# Patient Record
Sex: Female | Born: 1945 | Race: White | Hispanic: No | Marital: Single | Smoking: Former smoker
Health system: Southern US, Community
[De-identification: ages and names within clinical notes are randomized; demographics above are authoritative.]

## PROBLEM LIST (undated history)

## (undated) DIAGNOSIS — I779 Disorder of arteries and arterioles, unspecified: Secondary | ICD-10-CM

## (undated) DIAGNOSIS — E785 Hyperlipidemia, unspecified: Secondary | ICD-10-CM

## (undated) DIAGNOSIS — Z800 Family history of malignant neoplasm of digestive organs: Secondary | ICD-10-CM

## (undated) DIAGNOSIS — T7840XA Allergy, unspecified, initial encounter: Secondary | ICD-10-CM

## (undated) DIAGNOSIS — C50919 Malignant neoplasm of unspecified site of unspecified female breast: Secondary | ICD-10-CM

## (undated) DIAGNOSIS — D509 Iron deficiency anemia, unspecified: Secondary | ICD-10-CM

## (undated) DIAGNOSIS — K219 Gastro-esophageal reflux disease without esophagitis: Secondary | ICD-10-CM

## (undated) DIAGNOSIS — Z923 Personal history of irradiation: Secondary | ICD-10-CM

## (undated) DIAGNOSIS — Z8042 Family history of malignant neoplasm of prostate: Secondary | ICD-10-CM

## (undated) DIAGNOSIS — Z8041 Family history of malignant neoplasm of ovary: Secondary | ICD-10-CM

## (undated) DIAGNOSIS — M8580 Other specified disorders of bone density and structure, unspecified site: Secondary | ICD-10-CM

## (undated) DIAGNOSIS — Z853 Personal history of malignant neoplasm of breast: Secondary | ICD-10-CM

## (undated) DIAGNOSIS — K2970 Gastritis, unspecified, without bleeding: Secondary | ICD-10-CM

## (undated) DIAGNOSIS — K2270 Barrett's esophagus without dysplasia: Secondary | ICD-10-CM

## (undated) HISTORY — DX: Barrett's esophagus without dysplasia: K22.70

## (undated) HISTORY — DX: Disorder of arteries and arterioles, unspecified: I77.9

## (undated) HISTORY — PX: COLONOSCOPY: SHX174

## (undated) HISTORY — PX: COLONOSCOPY: SHX5424

## (undated) HISTORY — PX: UPPER GASTROINTESTINAL ENDOSCOPY: SHX188

## (undated) HISTORY — PX: OVARIAN CYST REMOVAL: SHX89

## (undated) HISTORY — DX: Malignant neoplasm of unspecified site of unspecified female breast: C50.919

## (undated) HISTORY — PX: BREAST LUMPECTOMY: SHX2

## (undated) HISTORY — DX: Hyperlipidemia, unspecified: E78.5

## (undated) HISTORY — PX: ESOPHAGOGASTRODUODENOSCOPY: SHX1529

## (undated) HISTORY — DX: Iron deficiency anemia, unspecified: D50.9

## (undated) HISTORY — DX: Family history of malignant neoplasm of digestive organs: Z80.0

## (undated) HISTORY — PX: BREAST BIOPSY: SHX20

## (undated) HISTORY — DX: Family history of malignant neoplasm of ovary: Z80.41

## (undated) HISTORY — DX: Personal history of malignant neoplasm of breast: Z85.3

## (undated) HISTORY — DX: Gastritis, unspecified, without bleeding: K29.70

## (undated) HISTORY — DX: Gastro-esophageal reflux disease without esophagitis: K21.9

## (undated) HISTORY — DX: Allergy, unspecified, initial encounter: T78.40XA

## (undated) HISTORY — PX: OTHER SURGICAL HISTORY: SHX169

## (undated) HISTORY — DX: Other specified disorders of bone density and structure, unspecified site: M85.80

## (undated) HISTORY — DX: Family history of malignant neoplasm of prostate: Z80.42

## (undated) HISTORY — DX: Personal history of irradiation: Z92.3

---

## 1982-11-27 HISTORY — PX: ABDOMINAL HYSTERECTOMY: SHX81

## 1999-09-23 ENCOUNTER — Encounter: Payer: Self-pay | Admitting: Obstetrics and Gynecology

## 1999-09-23 ENCOUNTER — Encounter: Admission: RE | Admit: 1999-09-23 | Discharge: 1999-09-23 | Payer: Self-pay | Admitting: Obstetrics and Gynecology

## 2000-04-19 ENCOUNTER — Emergency Department (HOSPITAL_COMMUNITY): Admission: EM | Admit: 2000-04-19 | Discharge: 2000-04-19 | Payer: Self-pay

## 2000-04-19 ENCOUNTER — Encounter: Payer: Self-pay | Admitting: Emergency Medicine

## 2000-06-22 ENCOUNTER — Other Ambulatory Visit: Admission: RE | Admit: 2000-06-22 | Discharge: 2000-06-22 | Payer: Self-pay | Admitting: Family Medicine

## 2000-06-27 ENCOUNTER — Encounter: Admission: RE | Admit: 2000-06-27 | Discharge: 2000-06-27 | Payer: Self-pay | Admitting: Family Medicine

## 2000-06-27 ENCOUNTER — Encounter: Payer: Self-pay | Admitting: Family Medicine

## 2000-07-02 ENCOUNTER — Encounter: Admission: RE | Admit: 2000-07-02 | Discharge: 2000-07-02 | Payer: Self-pay | Admitting: Family Medicine

## 2000-07-02 ENCOUNTER — Encounter: Payer: Self-pay | Admitting: Family Medicine

## 2000-07-27 ENCOUNTER — Ambulatory Visit (HOSPITAL_BASED_OUTPATIENT_CLINIC_OR_DEPARTMENT_OTHER): Admission: RE | Admit: 2000-07-27 | Discharge: 2000-07-27 | Payer: Self-pay | Admitting: General Surgery

## 2000-11-02 ENCOUNTER — Other Ambulatory Visit: Admission: RE | Admit: 2000-11-02 | Discharge: 2000-11-02 | Payer: Self-pay | Admitting: Obstetrics and Gynecology

## 2001-07-19 ENCOUNTER — Encounter: Payer: Self-pay | Admitting: Obstetrics and Gynecology

## 2001-07-19 ENCOUNTER — Encounter: Admission: RE | Admit: 2001-07-19 | Discharge: 2001-07-19 | Payer: Self-pay | Admitting: Obstetrics and Gynecology

## 2002-11-27 HISTORY — PX: OTHER SURGICAL HISTORY: SHX169

## 2002-12-12 ENCOUNTER — Encounter: Admission: RE | Admit: 2002-12-12 | Discharge: 2002-12-12 | Payer: Self-pay | Admitting: Obstetrics and Gynecology

## 2002-12-12 ENCOUNTER — Encounter: Payer: Self-pay | Admitting: Obstetrics and Gynecology

## 2002-12-16 ENCOUNTER — Other Ambulatory Visit: Admission: RE | Admit: 2002-12-16 | Discharge: 2002-12-16 | Payer: Self-pay | Admitting: Diagnostic Radiology

## 2002-12-16 ENCOUNTER — Encounter: Admission: RE | Admit: 2002-12-16 | Discharge: 2002-12-16 | Payer: Self-pay | Admitting: Obstetrics and Gynecology

## 2002-12-16 ENCOUNTER — Encounter: Payer: Self-pay | Admitting: Obstetrics and Gynecology

## 2002-12-16 ENCOUNTER — Encounter (INDEPENDENT_AMBULATORY_CARE_PROVIDER_SITE_OTHER): Payer: Self-pay | Admitting: *Deleted

## 2002-12-24 ENCOUNTER — Encounter: Payer: Self-pay | Admitting: General Surgery

## 2002-12-24 ENCOUNTER — Ambulatory Visit (HOSPITAL_COMMUNITY): Admission: RE | Admit: 2002-12-24 | Discharge: 2002-12-24 | Payer: Self-pay | Admitting: General Surgery

## 2002-12-26 ENCOUNTER — Encounter: Payer: Self-pay | Admitting: General Surgery

## 2002-12-26 ENCOUNTER — Encounter: Admission: RE | Admit: 2002-12-26 | Discharge: 2002-12-26 | Payer: Self-pay | Admitting: General Surgery

## 2002-12-28 DIAGNOSIS — C50919 Malignant neoplasm of unspecified site of unspecified female breast: Secondary | ICD-10-CM

## 2002-12-28 HISTORY — DX: Malignant neoplasm of unspecified site of unspecified female breast: C50.919

## 2002-12-29 ENCOUNTER — Ambulatory Visit (HOSPITAL_BASED_OUTPATIENT_CLINIC_OR_DEPARTMENT_OTHER): Admission: RE | Admit: 2002-12-29 | Discharge: 2002-12-29 | Payer: Self-pay | Admitting: General Surgery

## 2002-12-29 ENCOUNTER — Encounter: Admission: RE | Admit: 2002-12-29 | Discharge: 2002-12-29 | Payer: Self-pay | Admitting: General Surgery

## 2002-12-29 ENCOUNTER — Encounter: Payer: Self-pay | Admitting: General Surgery

## 2002-12-29 ENCOUNTER — Encounter (INDEPENDENT_AMBULATORY_CARE_PROVIDER_SITE_OTHER): Payer: Self-pay | Admitting: Specialist

## 2003-01-13 ENCOUNTER — Ambulatory Visit: Admission: RE | Admit: 2003-01-13 | Discharge: 2003-04-02 | Payer: Self-pay | Admitting: *Deleted

## 2003-01-15 ENCOUNTER — Encounter: Admission: RE | Admit: 2003-01-15 | Discharge: 2003-01-15 | Payer: Self-pay | Admitting: *Deleted

## 2003-01-26 ENCOUNTER — Encounter: Admission: RE | Admit: 2003-01-26 | Discharge: 2003-01-26 | Payer: Self-pay | Admitting: *Deleted

## 2003-01-26 ENCOUNTER — Encounter: Payer: Self-pay | Admitting: Oncology

## 2003-01-26 ENCOUNTER — Ambulatory Visit (HOSPITAL_COMMUNITY): Admission: RE | Admit: 2003-01-26 | Discharge: 2003-01-26 | Payer: Self-pay | Admitting: Oncology

## 2003-04-14 ENCOUNTER — Ambulatory Visit: Admission: RE | Admit: 2003-04-14 | Discharge: 2003-04-14 | Payer: Self-pay | Admitting: *Deleted

## 2003-08-04 ENCOUNTER — Encounter: Payer: Self-pay | Admitting: Oncology

## 2003-08-04 ENCOUNTER — Encounter: Admission: RE | Admit: 2003-08-04 | Discharge: 2003-08-04 | Payer: Self-pay | Admitting: Oncology

## 2004-01-25 ENCOUNTER — Encounter (INDEPENDENT_AMBULATORY_CARE_PROVIDER_SITE_OTHER): Payer: Self-pay | Admitting: Specialist

## 2004-01-25 ENCOUNTER — Encounter: Admission: RE | Admit: 2004-01-25 | Discharge: 2004-01-25 | Payer: Self-pay | Admitting: Oncology

## 2004-02-22 ENCOUNTER — Encounter: Admission: RE | Admit: 2004-02-22 | Discharge: 2004-02-22 | Payer: Self-pay | Admitting: Oncology

## 2004-03-08 ENCOUNTER — Encounter: Admission: RE | Admit: 2004-03-08 | Discharge: 2004-03-08 | Payer: Self-pay | Admitting: Oncology

## 2004-03-17 ENCOUNTER — Encounter: Admission: RE | Admit: 2004-03-17 | Discharge: 2004-03-17 | Payer: Self-pay | Admitting: Oncology

## 2004-03-25 ENCOUNTER — Encounter: Admission: RE | Admit: 2004-03-25 | Discharge: 2004-03-25 | Payer: Self-pay | Admitting: General Surgery

## 2004-05-13 ENCOUNTER — Encounter: Admission: RE | Admit: 2004-05-13 | Discharge: 2004-05-13 | Payer: Self-pay | Admitting: Oncology

## 2004-05-24 ENCOUNTER — Ambulatory Visit: Admission: RE | Admit: 2004-05-24 | Discharge: 2004-07-01 | Payer: Self-pay | Admitting: *Deleted

## 2004-05-25 ENCOUNTER — Ambulatory Visit (HOSPITAL_COMMUNITY): Admission: RE | Admit: 2004-05-25 | Discharge: 2004-05-25 | Payer: Self-pay | Admitting: Oncology

## 2004-08-05 ENCOUNTER — Ambulatory Visit: Admission: RE | Admit: 2004-08-05 | Discharge: 2004-08-05 | Payer: Self-pay | Admitting: *Deleted

## 2004-10-07 ENCOUNTER — Encounter: Admission: RE | Admit: 2004-10-07 | Discharge: 2004-10-07 | Payer: Self-pay | Admitting: *Deleted

## 2005-01-27 ENCOUNTER — Encounter: Admission: RE | Admit: 2005-01-27 | Discharge: 2005-01-27 | Payer: Self-pay | Admitting: Oncology

## 2005-02-09 ENCOUNTER — Ambulatory Visit: Payer: Self-pay | Admitting: Oncology

## 2005-02-09 ENCOUNTER — Encounter: Admission: RE | Admit: 2005-02-09 | Discharge: 2005-02-09 | Payer: Self-pay | Admitting: General Surgery

## 2005-04-06 ENCOUNTER — Ambulatory Visit: Payer: Self-pay | Admitting: Oncology

## 2005-07-27 ENCOUNTER — Ambulatory Visit: Payer: Self-pay | Admitting: Oncology

## 2005-10-13 ENCOUNTER — Encounter: Admission: RE | Admit: 2005-10-13 | Discharge: 2005-10-13 | Payer: Self-pay | Admitting: Oncology

## 2005-11-27 DIAGNOSIS — K2270 Barrett's esophagus without dysplasia: Secondary | ICD-10-CM

## 2005-11-27 HISTORY — DX: Barrett's esophagus without dysplasia: K22.70

## 2006-01-25 ENCOUNTER — Ambulatory Visit: Payer: Self-pay | Admitting: Oncology

## 2006-01-31 ENCOUNTER — Encounter: Admission: RE | Admit: 2006-01-31 | Discharge: 2006-01-31 | Payer: Self-pay | Admitting: Oncology

## 2006-02-01 ENCOUNTER — Encounter: Admission: RE | Admit: 2006-02-01 | Discharge: 2006-02-01 | Payer: Self-pay | Admitting: Oncology

## 2006-02-01 ENCOUNTER — Encounter (INDEPENDENT_AMBULATORY_CARE_PROVIDER_SITE_OTHER): Payer: Self-pay | Admitting: *Deleted

## 2006-02-23 ENCOUNTER — Encounter: Admission: RE | Admit: 2006-02-23 | Discharge: 2006-02-23 | Payer: Self-pay | Admitting: Oncology

## 2006-02-28 LAB — CLOSTRIDIUM DIFFICILE EIA

## 2006-03-06 LAB — CBC WITH DIFFERENTIAL/PLATELET
BASO%: 0.4 % (ref 0.0–2.0)
Basophils Absolute: 0 10*3/uL (ref 0.0–0.1)
EOS%: 0.4 % (ref 0.0–7.0)
Eosinophils Absolute: 0 10*3/uL (ref 0.0–0.5)
HCT: 34.7 % — ABNORMAL LOW (ref 34.8–46.6)
HGB: 10.9 g/dL — ABNORMAL LOW (ref 11.6–15.9)
LYMPH%: 17.5 % (ref 14.0–48.0)
MCH: 22.5 pg — ABNORMAL LOW (ref 26.0–34.0)
MCHC: 31.4 g/dL — ABNORMAL LOW (ref 32.0–36.0)
MCV: 71.8 fL — ABNORMAL LOW (ref 81.0–101.0)
MONO#: 0.6 10*3/uL (ref 0.1–0.9)
MONO%: 7.5 % (ref 0.0–13.0)
NEUT#: 6.3 10*3/uL (ref 1.5–6.5)
NEUT%: 74.2 % (ref 39.6–76.8)
Platelets: 318 10*3/uL (ref 145–400)
RBC: 4.83 10*6/uL (ref 3.70–5.32)
RDW: 23.1 % — ABNORMAL HIGH (ref 11.3–14.5)
WBC: 8.5 10*3/uL (ref 3.9–10.0)
lymph#: 1.5 10*3/uL (ref 0.9–3.3)

## 2006-03-12 ENCOUNTER — Ambulatory Visit (HOSPITAL_COMMUNITY): Admission: RE | Admit: 2006-03-12 | Discharge: 2006-03-12 | Payer: Self-pay | Admitting: Unknown Physician Specialty

## 2006-04-13 ENCOUNTER — Ambulatory Visit: Payer: Self-pay | Admitting: Oncology

## 2006-04-13 LAB — CBC WITH DIFFERENTIAL/PLATELET
BASO%: 0.5 % (ref 0.0–2.0)
Basophils Absolute: 0 10*3/uL (ref 0.0–0.1)
EOS%: 1.8 % (ref 0.0–7.0)
Eosinophils Absolute: 0.1 10*3/uL (ref 0.0–0.5)
HCT: 33.7 % — ABNORMAL LOW (ref 34.8–46.6)
HGB: 10.8 g/dL — ABNORMAL LOW (ref 11.6–15.9)
LYMPH%: 27 % (ref 14.0–48.0)
MCH: 22.7 pg — ABNORMAL LOW (ref 26.0–34.0)
MCHC: 31.9 g/dL — ABNORMAL LOW (ref 32.0–36.0)
MCV: 71.3 fL — ABNORMAL LOW (ref 81.0–101.0)
MONO#: 0.5 10*3/uL (ref 0.1–0.9)
MONO%: 7.9 % (ref 0.0–13.0)
NEUT#: 3.6 10*3/uL (ref 1.5–6.5)
NEUT%: 62.8 % (ref 39.6–76.8)
Platelets: 294 10*3/uL (ref 145–400)
RBC: 4.73 10*6/uL (ref 3.70–5.32)
RDW: 19.5 % — ABNORMAL HIGH (ref 11.3–14.5)
WBC: 5.7 10*3/uL (ref 3.9–10.0)
lymph#: 1.5 10*3/uL (ref 0.9–3.3)

## 2006-05-08 ENCOUNTER — Ambulatory Visit: Payer: Self-pay | Admitting: Internal Medicine

## 2006-05-21 ENCOUNTER — Ambulatory Visit: Payer: Self-pay | Admitting: Internal Medicine

## 2006-06-18 ENCOUNTER — Ambulatory Visit: Payer: Self-pay | Admitting: Oncology

## 2006-06-22 LAB — CBC WITH DIFFERENTIAL/PLATELET
BASO%: 0.9 % (ref 0.0–2.0)
Basophils Absolute: 0 10*3/uL (ref 0.0–0.1)
EOS%: 1.1 % (ref 0.0–7.0)
Eosinophils Absolute: 0.1 10*3/uL (ref 0.0–0.5)
HCT: 39.9 % (ref 34.8–46.6)
HGB: 13.1 g/dL (ref 11.6–15.9)
LYMPH%: 34.7 % (ref 14.0–48.0)
MCH: 25.8 pg — ABNORMAL LOW (ref 26.0–34.0)
MCHC: 32.9 g/dL (ref 32.0–36.0)
MCV: 78.4 fL — ABNORMAL LOW (ref 81.0–101.0)
MONO#: 0.4 10*3/uL (ref 0.1–0.9)
MONO%: 8 % (ref 0.0–13.0)
NEUT#: 2.8 10*3/uL (ref 1.5–6.5)
NEUT%: 55.3 % (ref 39.6–76.8)
Platelets: 267 10*3/uL (ref 145–400)
RBC: 5.09 10*6/uL (ref 3.70–5.32)
RDW: 21.2 % — ABNORMAL HIGH (ref 11.3–14.5)
WBC: 5.1 10*3/uL (ref 3.9–10.0)
lymph#: 1.8 10*3/uL (ref 0.9–3.3)

## 2006-06-22 LAB — IRON AND TIBC
%SAT: 14 % — ABNORMAL LOW (ref 20–55)
Iron: 72 ug/dL (ref 42–145)
TIBC: 503 ug/dL — ABNORMAL HIGH (ref 250–470)
UIBC: 431 ug/dL

## 2006-06-22 LAB — FERRITIN: Ferritin: 3 ng/mL — ABNORMAL LOW (ref 10–291)

## 2006-06-29 ENCOUNTER — Ambulatory Visit: Payer: Self-pay | Admitting: Internal Medicine

## 2006-08-02 ENCOUNTER — Ambulatory Visit: Payer: Self-pay | Admitting: Internal Medicine

## 2006-09-12 ENCOUNTER — Ambulatory Visit: Payer: Self-pay | Admitting: Oncology

## 2006-12-11 ENCOUNTER — Ambulatory Visit: Payer: Self-pay | Admitting: Oncology

## 2006-12-14 LAB — COMPREHENSIVE METABOLIC PANEL
ALT: 16 U/L (ref 0–35)
AST: 17 U/L (ref 0–37)
Albumin: 4.7 g/dL (ref 3.5–5.2)
Alkaline Phosphatase: 74 U/L (ref 39–117)
BUN: 16 mg/dL (ref 6–23)
CO2: 22 mEq/L (ref 19–32)
Calcium: 9 mg/dL (ref 8.4–10.5)
Chloride: 110 mEq/L (ref 96–112)
Creatinine, Ser: 0.91 mg/dL (ref 0.40–1.20)
Glucose, Bld: 85 mg/dL (ref 70–99)
Potassium: 3.7 mEq/L (ref 3.5–5.3)
Sodium: 145 mEq/L (ref 135–145)
Total Bilirubin: 0.6 mg/dL (ref 0.3–1.2)
Total Protein: 6.9 g/dL (ref 6.0–8.3)

## 2006-12-14 LAB — CBC WITH DIFFERENTIAL/PLATELET
BASO%: 0.7 % (ref 0.0–2.0)
Basophils Absolute: 0 10*3/uL (ref 0.0–0.1)
EOS%: 1.9 % (ref 0.0–7.0)
Eosinophils Absolute: 0.1 10*3/uL (ref 0.0–0.5)
HCT: 42.7 % (ref 34.8–46.6)
HGB: 13.9 g/dL (ref 11.6–15.9)
LYMPH%: 31.2 % (ref 14.0–48.0)
MCH: 29.1 pg (ref 26.0–34.0)
MCHC: 32.6 g/dL (ref 32.0–36.0)
MCV: 89.4 fL (ref 81.0–101.0)
MONO#: 0.4 10*3/uL (ref 0.1–0.9)
MONO%: 6.6 % (ref 0.0–13.0)
NEUT#: 3.2 10*3/uL (ref 1.5–6.5)
NEUT%: 59.6 % (ref 39.6–76.8)
Platelets: 221 10*3/uL (ref 145–400)
RBC: 4.77 10*6/uL (ref 3.70–5.32)
RDW: 14.3 % (ref 11.3–14.5)
WBC: 5.4 10*3/uL (ref 3.9–10.0)
lymph#: 1.7 10*3/uL (ref 0.9–3.3)

## 2006-12-14 LAB — IRON AND TIBC
%SAT: 14 % — ABNORMAL LOW (ref 20–55)
Iron: 58 ug/dL (ref 42–145)
TIBC: 418 ug/dL (ref 250–470)
UIBC: 360 ug/dL

## 2006-12-14 LAB — FERRITIN: Ferritin: 7 ng/mL — ABNORMAL LOW (ref 10–291)

## 2007-02-08 ENCOUNTER — Encounter: Admission: RE | Admit: 2007-02-08 | Discharge: 2007-02-08 | Payer: Self-pay | Admitting: Oncology

## 2007-05-29 ENCOUNTER — Ambulatory Visit: Payer: Self-pay | Admitting: Internal Medicine

## 2007-06-12 ENCOUNTER — Ambulatory Visit: Payer: Self-pay | Admitting: Internal Medicine

## 2007-07-24 ENCOUNTER — Ambulatory Visit: Payer: Self-pay | Admitting: Oncology

## 2007-07-26 LAB — CBC WITH DIFFERENTIAL/PLATELET
BASO%: 0.6 % (ref 0.0–2.0)
Basophils Absolute: 0 10*3/uL (ref 0.0–0.1)
EOS%: 1.8 % (ref 0.0–7.0)
Eosinophils Absolute: 0.1 10*3/uL (ref 0.0–0.5)
HCT: 42.9 % (ref 34.8–46.6)
HGB: 15.2 g/dL (ref 11.6–15.9)
LYMPH%: 37.7 % (ref 14.0–48.0)
MCH: 32.5 pg (ref 26.0–34.0)
MCHC: 35.4 g/dL (ref 32.0–36.0)
MCV: 91.8 fL (ref 81.0–101.0)
MONO#: 0.4 10*3/uL (ref 0.1–0.9)
MONO%: 7.4 % (ref 0.0–13.0)
NEUT#: 2.9 10*3/uL (ref 1.5–6.5)
NEUT%: 52.5 % (ref 39.6–76.8)
Platelets: 220 10*3/uL (ref 145–400)
RBC: 4.68 10*6/uL (ref 3.70–5.32)
RDW: 13 % (ref 11.3–14.5)
WBC: 5.5 10*3/uL (ref 3.9–10.0)
lymph#: 2.1 10*3/uL (ref 0.9–3.3)

## 2007-07-27 LAB — COMPREHENSIVE METABOLIC PANEL
ALT: 27 U/L (ref 0–35)
AST: 22 U/L (ref 0–37)
Albumin: 4.7 g/dL (ref 3.5–5.2)
Alkaline Phosphatase: 80 U/L (ref 39–117)
BUN: 18 mg/dL (ref 6–23)
CO2: 22 mEq/L (ref 19–32)
Calcium: 8.8 mg/dL (ref 8.4–10.5)
Chloride: 106 mEq/L (ref 96–112)
Creatinine, Ser: 1.04 mg/dL (ref 0.40–1.20)
Glucose, Bld: 87 mg/dL (ref 70–99)
Potassium: 3.6 mEq/L (ref 3.5–5.3)
Sodium: 138 mEq/L (ref 135–145)
Total Bilirubin: 0.6 mg/dL (ref 0.3–1.2)
Total Protein: 7 g/dL (ref 6.0–8.3)

## 2007-07-27 LAB — FERRITIN: Ferritin: 24 ng/mL (ref 10–291)

## 2007-10-04 ENCOUNTER — Encounter: Admission: RE | Admit: 2007-10-04 | Discharge: 2007-10-04 | Payer: Self-pay | Admitting: Oncology

## 2008-01-29 ENCOUNTER — Ambulatory Visit: Payer: Self-pay | Admitting: Oncology

## 2008-01-31 LAB — COMPREHENSIVE METABOLIC PANEL
ALT: 30 U/L (ref 0–35)
AST: 22 U/L (ref 0–37)
Albumin: 4.4 g/dL (ref 3.5–5.2)
Alkaline Phosphatase: 77 U/L (ref 39–117)
BUN: 15 mg/dL (ref 6–23)
CO2: 23 mEq/L (ref 19–32)
Calcium: 9.2 mg/dL (ref 8.4–10.5)
Chloride: 109 mEq/L (ref 96–112)
Creatinine, Ser: 0.99 mg/dL (ref 0.40–1.20)
Glucose, Bld: 102 mg/dL — ABNORMAL HIGH (ref 70–99)
Potassium: 4.6 mEq/L (ref 3.5–5.3)
Sodium: 142 mEq/L (ref 135–145)
Total Bilirubin: 0.6 mg/dL (ref 0.3–1.2)
Total Protein: 6.7 g/dL (ref 6.0–8.3)

## 2008-01-31 LAB — CBC WITH DIFFERENTIAL/PLATELET
BASO%: 0.8 % (ref 0.0–2.0)
Basophils Absolute: 0 10*3/uL (ref 0.0–0.1)
EOS%: 2.5 % (ref 0.0–7.0)
Eosinophils Absolute: 0.1 10*3/uL (ref 0.0–0.5)
HCT: 40.4 % (ref 34.8–46.6)
HGB: 14.1 g/dL (ref 11.6–15.9)
LYMPH%: 36.7 % (ref 14.0–48.0)
MCH: 31.7 pg (ref 26.0–34.0)
MCHC: 34.9 g/dL (ref 32.0–36.0)
MCV: 90.7 fL (ref 81.0–101.0)
MONO#: 0.4 10*3/uL (ref 0.1–0.9)
MONO%: 8.6 % (ref 0.0–13.0)
NEUT#: 2.1 10*3/uL (ref 1.5–6.5)
NEUT%: 51.4 % (ref 39.6–76.8)
Platelets: 272 10*3/uL (ref 145–400)
RBC: 4.45 10*6/uL (ref 3.70–5.32)
RDW: 13.4 % (ref 11.3–14.5)
WBC: 4.1 10*3/uL (ref 3.9–10.0)
lymph#: 1.5 10*3/uL (ref 0.9–3.3)

## 2008-02-14 ENCOUNTER — Encounter: Admission: RE | Admit: 2008-02-14 | Discharge: 2008-02-14 | Payer: Self-pay | Admitting: Oncology

## 2008-02-18 ENCOUNTER — Encounter: Admission: RE | Admit: 2008-02-18 | Discharge: 2008-02-18 | Payer: Self-pay | Admitting: Oncology

## 2008-05-20 ENCOUNTER — Ambulatory Visit: Payer: Self-pay | Admitting: Oncology

## 2008-05-22 ENCOUNTER — Encounter: Payer: Self-pay | Admitting: Internal Medicine

## 2008-05-22 LAB — CBC WITH DIFFERENTIAL/PLATELET
BASO%: 0.6 % (ref 0.0–2.0)
Basophils Absolute: 0 10*3/uL (ref 0.0–0.1)
EOS%: 3.7 % (ref 0.0–7.0)
Eosinophils Absolute: 0.2 10*3/uL (ref 0.0–0.5)
HCT: 41 % (ref 34.8–46.6)
HGB: 14.5 g/dL (ref 11.6–15.9)
LYMPH%: 38.1 % (ref 14.0–48.0)
MCH: 31.7 pg (ref 26.0–34.0)
MCHC: 35.5 g/dL (ref 32.0–36.0)
MCV: 89.4 fL (ref 81.0–101.0)
MONO#: 0.4 10*3/uL (ref 0.1–0.9)
MONO%: 6.9 % (ref 0.0–13.0)
NEUT#: 2.8 10*3/uL (ref 1.5–6.5)
NEUT%: 50.7 % (ref 39.6–76.8)
Platelets: 221 10*3/uL (ref 145–400)
RBC: 4.58 10*6/uL (ref 3.70–5.32)
RDW: 13.2 % (ref 11.3–14.5)
WBC: 5.6 10*3/uL (ref 3.9–10.0)
lymph#: 2.1 10*3/uL (ref 0.9–3.3)

## 2008-08-19 ENCOUNTER — Ambulatory Visit: Payer: Self-pay | Admitting: Oncology

## 2008-08-21 LAB — CBC WITH DIFFERENTIAL/PLATELET
BASO%: 1.7 % (ref 0.0–2.0)
Basophils Absolute: 0.1 10*3/uL (ref 0.0–0.1)
EOS%: 4.2 % (ref 0.0–7.0)
Eosinophils Absolute: 0.2 10*3/uL (ref 0.0–0.5)
HCT: 43.7 % (ref 34.8–46.6)
HGB: 15.3 g/dL (ref 11.6–15.9)
LYMPH%: 38.6 % (ref 14.0–48.0)
MCH: 31.4 pg (ref 26.0–34.0)
MCHC: 35 g/dL (ref 32.0–36.0)
MCV: 89.7 fL (ref 81.0–101.0)
MONO#: 0.4 10*3/uL (ref 0.1–0.9)
MONO%: 7.6 % (ref 0.0–13.0)
NEUT#: 2.5 10*3/uL (ref 1.5–6.5)
NEUT%: 47.9 % (ref 39.6–76.8)
Platelets: 218 10*3/uL (ref 145–400)
RBC: 4.87 10*6/uL (ref 3.70–5.32)
RDW: 12 % (ref 11.3–14.5)
WBC: 5.2 10*3/uL (ref 3.9–10.0)
lymph#: 2 10*3/uL (ref 0.9–3.3)

## 2008-10-16 ENCOUNTER — Encounter: Admission: RE | Admit: 2008-10-16 | Discharge: 2008-10-16 | Payer: Self-pay | Admitting: Oncology

## 2008-10-28 ENCOUNTER — Ambulatory Visit: Payer: Self-pay | Admitting: Oncology

## 2008-10-30 ENCOUNTER — Encounter: Payer: Self-pay | Admitting: Internal Medicine

## 2008-10-30 LAB — COMPREHENSIVE METABOLIC PANEL
ALT: 21 U/L (ref 0–35)
AST: 20 U/L (ref 0–37)
Albumin: 4.8 g/dL (ref 3.5–5.2)
Alkaline Phosphatase: 62 U/L (ref 39–117)
BUN: 13 mg/dL (ref 6–23)
CO2: 23 mEq/L (ref 19–32)
Calcium: 9.5 mg/dL (ref 8.4–10.5)
Chloride: 107 mEq/L (ref 96–112)
Creatinine, Ser: 0.92 mg/dL (ref 0.40–1.20)
Glucose, Bld: 80 mg/dL (ref 70–99)
Potassium: 4 mEq/L (ref 3.5–5.3)
Sodium: 141 mEq/L (ref 135–145)
Total Bilirubin: 0.7 mg/dL (ref 0.3–1.2)
Total Protein: 7 g/dL (ref 6.0–8.3)

## 2008-10-30 LAB — CBC WITH DIFFERENTIAL/PLATELET
BASO%: 0.6 % (ref 0.0–2.0)
Basophils Absolute: 0 10*3/uL (ref 0.0–0.1)
EOS%: 2.1 % (ref 0.0–7.0)
Eosinophils Absolute: 0.1 10*3/uL (ref 0.0–0.5)
HCT: 43.3 % (ref 34.8–46.6)
HGB: 15.1 g/dL (ref 11.6–15.9)
LYMPH%: 40.4 % (ref 14.0–48.0)
MCH: 32.2 pg (ref 26.0–34.0)
MCHC: 35 g/dL (ref 32.0–36.0)
MCV: 92.1 fL (ref 81.0–101.0)
MONO#: 0.3 10*3/uL (ref 0.1–0.9)
MONO%: 7.6 % (ref 0.0–13.0)
NEUT#: 2.2 10*3/uL (ref 1.5–6.5)
NEUT%: 49.3 % (ref 39.6–76.8)
Platelets: 196 10*3/uL (ref 145–400)
RBC: 4.7 10*6/uL (ref 3.70–5.32)
RDW: 13.2 % (ref 11.3–14.5)
WBC: 4.4 10*3/uL (ref 3.9–10.0)
lymph#: 1.8 10*3/uL (ref 0.9–3.3)

## 2009-02-19 ENCOUNTER — Encounter: Admission: RE | Admit: 2009-02-19 | Discharge: 2009-02-19 | Payer: Self-pay | Admitting: Oncology

## 2009-05-05 ENCOUNTER — Ambulatory Visit: Payer: Self-pay | Admitting: Oncology

## 2009-05-07 ENCOUNTER — Encounter: Payer: Self-pay | Admitting: Internal Medicine

## 2010-02-25 ENCOUNTER — Encounter: Admission: RE | Admit: 2010-02-25 | Discharge: 2010-02-25 | Payer: Self-pay | Admitting: Oncology

## 2010-05-11 ENCOUNTER — Ambulatory Visit: Payer: Self-pay | Admitting: Oncology

## 2010-05-13 ENCOUNTER — Encounter: Payer: Self-pay | Admitting: Internal Medicine

## 2010-05-13 LAB — CBC WITH DIFFERENTIAL/PLATELET
BASO%: 0.5 % (ref 0.0–2.0)
Basophils Absolute: 0 10*3/uL (ref 0.0–0.1)
EOS%: 2.2 % (ref 0.0–7.0)
Eosinophils Absolute: 0.1 10*3/uL (ref 0.0–0.5)
HCT: 41.6 % (ref 34.8–46.6)
HGB: 14.4 g/dL (ref 11.6–15.9)
LYMPH%: 35.1 % (ref 14.0–49.7)
MCH: 31.5 pg (ref 25.1–34.0)
MCHC: 34.5 g/dL (ref 31.5–36.0)
MCV: 91.3 fL (ref 79.5–101.0)
MONO#: 0.4 10*3/uL (ref 0.1–0.9)
MONO%: 8.8 % (ref 0.0–14.0)
NEUT#: 2.5 10*3/uL (ref 1.5–6.5)
NEUT%: 53.4 % (ref 38.4–76.8)
Platelets: 204 10*3/uL (ref 145–400)
RBC: 4.56 10*6/uL (ref 3.70–5.45)
RDW: 13.1 % (ref 11.2–14.5)
WBC: 4.7 10*3/uL (ref 3.9–10.3)
lymph#: 1.6 10*3/uL (ref 0.9–3.3)

## 2010-05-13 LAB — COMPREHENSIVE METABOLIC PANEL
ALT: 18 U/L (ref 0–35)
AST: 17 U/L (ref 0–37)
Albumin: 4.5 g/dL (ref 3.5–5.2)
Alkaline Phosphatase: 83 U/L (ref 39–117)
BUN: 19 mg/dL (ref 6–23)
CO2: 26 mEq/L (ref 19–32)
Calcium: 9 mg/dL (ref 8.4–10.5)
Chloride: 106 mEq/L (ref 96–112)
Creatinine, Ser: 0.81 mg/dL (ref 0.40–1.20)
Glucose, Bld: 67 mg/dL — ABNORMAL LOW (ref 70–99)
Potassium: 4.1 mEq/L (ref 3.5–5.3)
Sodium: 142 mEq/L (ref 135–145)
Total Bilirubin: 0.4 mg/dL (ref 0.3–1.2)
Total Protein: 6.8 g/dL (ref 6.0–8.3)

## 2010-12-17 ENCOUNTER — Other Ambulatory Visit: Payer: Self-pay | Admitting: Oncology

## 2010-12-17 DIAGNOSIS — Z1231 Encounter for screening mammogram for malignant neoplasm of breast: Secondary | ICD-10-CM

## 2010-12-18 ENCOUNTER — Encounter: Payer: Self-pay | Admitting: Oncology

## 2010-12-27 NOTE — Letter (Signed)
Summary: [facility]  [facility]   Imported By: Maryln Gottron 06/18/2009 14:49:53  _____________________________________________________________________  External Attachment:    Type:   Image     Comment:   External Document

## 2010-12-27 NOTE — Letter (Signed)
Summary: [facility]  [facility]   Imported By: Esmeralda Links D'jimraou 11/17/2008 13:59:48  _____________________________________________________________________  External Attachment:    Type:   Image     Comment:   External Document

## 2010-12-27 NOTE — Letter (Signed)
Summary: [facility]  [facility]   Imported By: Esmeralda Links D'jimraou 06/16/2008 12:33:22  _____________________________________________________________________  External Attachment:    Type:   Image     Comment:   External Document

## 2010-12-27 NOTE — Letter (Signed)
[facility]   Imported By: Lester [place] 06/16/2010 09:12:57  _____________________________________________________________________  External Attachment:    Type:   Image     Comment:   External Document

## 2011-03-15 ENCOUNTER — Ambulatory Visit
Admission: RE | Admit: 2011-03-15 | Discharge: 2011-03-15 | Disposition: A | Discharge status: Home / Self Care | Payer: Medicare Other | Source: Ambulatory Visit | Attending: Oncology | Admitting: Oncology

## 2011-03-15 DIAGNOSIS — Z1231 Encounter for screening mammogram for malignant neoplasm of breast: Secondary | ICD-10-CM

## 2011-04-14 NOTE — Op Note (Signed)
NAME:  Stacey Cain, Stacey Cain                           ACCOUNT NO.:  [id number]   MEDICAL RECORD NO.:  [id number]                   PATIENT TYPE:  AMB   LOCATION:  DSC                                  FACILITY:  [facility]   PHYSICIAN:  Rose Phi. Maple Hudson, M.D.                DATE OF BIRTH:  April 28, 1946   DATE OF PROCEDURE:  DATE OF DISCHARGE:                                 OPERATIVE REPORT   PREOPERATIVE DIAGNOSIS:  Stage 1 carcinoma of the right breast.   POSTOPERATIVE DIAGNOSIS:  Stage 1 carcinoma of the right breast.   OPERATIONS:  1. Blue dye injection.  2. Right partial mastectomy with needle localization and specimen     mammography.  3. Right sentinel lymph node biopsy.   SURGEON:  Rose Phi. Maple Hudson, M.D.   ANESTHESIA:  General.   OPERATIVE PROCEDURE:  This 66 year-old married female had presented with an  abnormal right breast mammogram with an area of microcalcification in the  upper outer quadrant of her right breast.  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  She is scheduled for  breast conservation including sentinel node biopsy.   Prior to coming to the operating room two wires were placed to bracket the  calcifications anteriorly and posteriorly and 1 microcurie of technetium  sulfa colloid was injected intradermally.   After suitable general anesthesia was induced the patient was placed in the  supine position with the right arm extended on the arm board.  Five cc of  Lymphazurin blue was injected in the subareolar tissue and the breast was  gently massaged for about three minutes.   We then prepped and draped the breast and shoulder and axilla.   A curved incision in the upper outer quadrant using the previously placed  wires was then made and both wires delivered into the incision as it was  spread apart and then a wide excision of the wires and surrounding tissues  was carried out.  The specimen was oriented for the  pathologist and  submitted for specimen mammography and then to the pathologist for touch  prep for margin evaluation.  The specimen mammogram confirmed the removal of  all the microcalcifications and the touch prep on the margins turned out to  be clean.   While that was being done we had scanned the right axilla and there was one  hot spot.  A short transverse right axillary incision was made with  dissection through the subcutaneous tissue to the clavipectoral fascia.  There was an enlarged, very hot and bluish lymph node which we removed and  had counts in excess of 1000.  After removing it there were no palpable  nodes, no visible nodes and the counts were zero.   That node was submitted for a touch prep and was interpreted as being  without metastatic disease.  The  incisions were all closed with 3-0 Vicryl  and subcuticular 4-0 Monocryl and Steri-Strips.  Dressings were applied.  The patient was transferred to the recovery room in satisfactory condition  having tolerated the procedure well.                                               Rose Phi. Maple Hudson, M.D.    PRY/MEDQ  D:  12/29/2002  T:  12/29/2002  Job:  [id number]   cc:   Miguel Aschoff, M.D.  [address]  [place]  [place]  [address]  Fax: [phone]   Malachi Pro. Ambrose Mantle, M.D.  [address]  [place]  [place] [address]  Fax: [phone]

## 2011-05-19 ENCOUNTER — Other Ambulatory Visit: Payer: Self-pay | Admitting: Oncology

## 2011-05-19 ENCOUNTER — Encounter (HOSPITAL_BASED_OUTPATIENT_CLINIC_OR_DEPARTMENT_OTHER): Payer: Medicare Other | Admitting: Oncology

## 2011-05-19 DIAGNOSIS — C50419 Malignant neoplasm of upper-outer quadrant of unspecified female breast: Secondary | ICD-10-CM

## 2011-05-19 DIAGNOSIS — Z170 Estrogen receptor positive status [ER+]: Secondary | ICD-10-CM

## 2011-05-19 DIAGNOSIS — Z853 Personal history of malignant neoplasm of breast: Secondary | ICD-10-CM

## 2011-05-19 DIAGNOSIS — M899 Disorder of bone, unspecified: Secondary | ICD-10-CM

## 2011-05-19 LAB — CBC WITH DIFFERENTIAL/PLATELET
BASO%: 0.8 % (ref 0.0–2.0)
Basophils Absolute: 0 10*3/uL (ref 0.0–0.1)
EOS%: 2.2 % (ref 0.0–7.0)
Eosinophils Absolute: 0.1 10*3/uL (ref 0.0–0.5)
HCT: 41.1 % (ref 34.8–46.6)
HGB: 13.9 g/dL (ref 11.6–15.9)
LYMPH%: 39.4 % (ref 14.0–49.7)
MCH: 29.7 pg (ref 25.1–34.0)
MCHC: 33.8 g/dL (ref 31.5–36.0)
MCV: 87.8 fL (ref 79.5–101.0)
MONO#: 0.3 10*3/uL (ref 0.1–0.9)
MONO%: 8.9 % (ref 0.0–14.0)
NEUT#: 1.8 10*3/uL (ref 1.5–6.5)
NEUT%: 48.7 % (ref 38.4–76.8)
Platelets: 179 10*3/uL (ref 145–400)
RBC: 4.68 10*6/uL (ref 3.70–5.45)
RDW: 13.5 % (ref 11.2–14.5)
WBC: 3.7 10*3/uL — ABNORMAL LOW (ref 3.9–10.3)
lymph#: 1.5 10*3/uL (ref 0.9–3.3)

## 2011-05-19 LAB — COMPREHENSIVE METABOLIC PANEL
ALT: 23 U/L (ref 0–35)
AST: 20 U/L (ref 0–37)
Albumin: 4.2 g/dL (ref 3.5–5.2)
Alkaline Phosphatase: 70 U/L (ref 39–117)
BUN: 20 mg/dL (ref 6–23)
CO2: 23 mEq/L (ref 19–32)
Calcium: 9 mg/dL (ref 8.4–10.5)
Chloride: 108 mEq/L (ref 96–112)
Creatinine, Ser: 0.83 mg/dL (ref 0.50–1.10)
Glucose, Bld: 141 mg/dL — ABNORMAL HIGH (ref 70–99)
Potassium: 4.2 mEq/L (ref 3.5–5.3)
Sodium: 140 mEq/L (ref 135–145)
Total Bilirubin: 0.4 mg/dL (ref 0.3–1.2)
Total Protein: 6.1 g/dL (ref 6.0–8.3)

## 2011-12-08 DIAGNOSIS — L821 Other seborrheic keratosis: Secondary | ICD-10-CM | POA: Diagnosis not present

## 2011-12-08 DIAGNOSIS — D239 Other benign neoplasm of skin, unspecified: Secondary | ICD-10-CM | POA: Diagnosis not present

## 2012-01-31 ENCOUNTER — Other Ambulatory Visit: Payer: Self-pay | Admitting: Family Medicine

## 2012-01-31 DIAGNOSIS — Z1231 Encounter for screening mammogram for malignant neoplasm of breast: Secondary | ICD-10-CM

## 2012-02-02 DIAGNOSIS — J019 Acute sinusitis, unspecified: Secondary | ICD-10-CM | POA: Diagnosis not present

## 2012-03-15 ENCOUNTER — Ambulatory Visit: Payer: Medicare Other

## 2012-03-19 ENCOUNTER — Ambulatory Visit
Admission: RE | Admit: 2012-03-19 | Discharge: 2012-03-19 | Disposition: A | Discharge status: Home / Self Care | Payer: Medicare Other | Source: Ambulatory Visit | Attending: Family Medicine | Admitting: Family Medicine

## 2012-03-19 DIAGNOSIS — Z1231 Encounter for screening mammogram for malignant neoplasm of breast: Secondary | ICD-10-CM | POA: Diagnosis not present

## 2012-06-12 DIAGNOSIS — H18599 Other hereditary corneal dystrophies, unspecified eye: Secondary | ICD-10-CM | POA: Diagnosis not present

## 2012-06-12 DIAGNOSIS — H2510 Age-related nuclear cataract, unspecified eye: Secondary | ICD-10-CM | POA: Diagnosis not present

## 2012-08-20 ENCOUNTER — Telehealth: Payer: Self-pay | Admitting: Internal Medicine

## 2012-08-20 ENCOUNTER — Encounter: Payer: Self-pay | Admitting: Internal Medicine

## 2012-08-20 NOTE — Telephone Encounter (Signed)
I have left a message for the patient to call back and schedule an office visit.

## 2012-08-20 NOTE — Telephone Encounter (Signed)
Patient advised that I will add her to the cancellation list.  She is advised that she can try Prilosec or Prevacid OTC until her appt.

## 2012-09-12 DIAGNOSIS — Z23 Encounter for immunization: Secondary | ICD-10-CM | POA: Diagnosis not present

## 2012-09-24 ENCOUNTER — Ambulatory Visit: Payer: Medicare Other | Admitting: Internal Medicine

## 2012-11-29 DIAGNOSIS — M899 Disorder of bone, unspecified: Secondary | ICD-10-CM | POA: Diagnosis not present

## 2012-11-29 DIAGNOSIS — M949 Disorder of cartilage, unspecified: Secondary | ICD-10-CM | POA: Diagnosis not present

## 2012-11-29 DIAGNOSIS — Z124 Encounter for screening for malignant neoplasm of cervix: Secondary | ICD-10-CM | POA: Diagnosis not present

## 2012-11-29 DIAGNOSIS — Z8041 Family history of malignant neoplasm of ovary: Secondary | ICD-10-CM | POA: Diagnosis not present

## 2012-11-29 DIAGNOSIS — Z853 Personal history of malignant neoplasm of breast: Secondary | ICD-10-CM | POA: Diagnosis not present

## 2012-12-06 ENCOUNTER — Ambulatory Visit (INDEPENDENT_AMBULATORY_CARE_PROVIDER_SITE_OTHER): Payer: Medicare Other | Admitting: Internal Medicine

## 2012-12-06 ENCOUNTER — Encounter: Payer: Self-pay | Admitting: Internal Medicine

## 2012-12-06 VITALS — BP 140/70 | HR 70

## 2012-12-06 DIAGNOSIS — R198 Other specified symptoms and signs involving the digestive system and abdomen: Secondary | ICD-10-CM

## 2012-12-06 DIAGNOSIS — K219 Gastro-esophageal reflux disease without esophagitis: Secondary | ICD-10-CM

## 2012-12-06 DIAGNOSIS — R194 Change in bowel habit: Secondary | ICD-10-CM

## 2012-12-06 MED ORDER — NA SULFATE-K SULFATE-MG SULF 17.5-3.13-1.6 GM/177ML PO SOLN: ORAL | Status: DC

## 2012-12-06 NOTE — Patient Instructions (Addendum)
You have been scheduled for an endoscopy and colonoscopy with propofol. Please follow the written instructions given to you at your visit today. Please use the prep kit you have been given today. If you use inhalers (even only as needed) or a CPAP machine, please bring them with you on the day of your procedure.  Thank you for choosing me and [facility].  Iva Boop, M.D., [facility]

## 2012-12-06 NOTE — Progress Notes (Signed)
Subjective:    Patient ID: Stacey Cain, female    DOB: 07-23-1946, 67 y.o.   MRN: [id number]  HPI This pleasant elderly ww returns after a > 3 year absence to discuss reflux and bowel habit change. She has a bubble in her throat at times with increasing frequency she says. ? If acid reflux. X 4-5 months and reminds of past sxs. She has a prior hx of Barrett's esophagus by another GI MD but EGD (me) 2008 did not show that. She remains concerned about that possibility and the risk of cancer. Has tried Prilosec with some success though not on now. Now has also been experiencing softer bowel movements that are difficult to cleanse for a few months. This is new x few months. Colonoscopy 2005 negative Noe Gens) No clear triggers (diet, stress). 3 coffees a day Husband has Wegener's granulomatosis - is on cytoxan, has been in [facility] at least once. Son is divorcing and daughter-in-law has had psychiatric hospitalization. No Known Allergies No outpatient prescriptions prior to visit.    Last reviewed on 12/06/2012  8:42 AM by June S McMurray, CMA Past Medical History  Diagnosis Date  . Hyperlipidemia   . Breast cancer 12/2002  . Iron deficiency anemia   . Barrett esophagus 2007  . Osteopenia   . GERD (gastroesophageal reflux disease)   . S/P radiation therapy   . Gastritis    Past Surgical History  Procedure Date  . Esophagogastroduodenoscopy multiple  . Abdominal hysterectomy 1984    unilateral oophorectomy  . Cesarean section   . Ovarian cyst removal     x2  . Right breast milk duct     benign  . Right breat lumpectomy 2004  . Colonoscopy    History   Social History  . Marital Status: Married    Spouse Name: N/A    Number of Children: 2  . Years of Education: N/A   Occupational History  . Librarian, academic    Social History Main Topics  . Smoking status: Former Games developer  . Smokeless tobacco: Never Used  . Alcohol Use: No  . Drug Use: No  . Sexually Active: None   Other Topics  Concern  . None   Social History Narrative   Married, legal assistant2 sons   Family History  Problem Relation Age of Onset  . Prostate cancer Father   . Ovarian cancer Mother   . Skin cancer Father    Review of Systems + as per HPI all other ROS negative     Objective:   Physical Exam General:  Well-developed, well-nourished and in no acute distress Eyes:  anicteric. ENT:   Mouth and posterior pharynx free of lesions.  Neck:   supple w/o thyromegaly or mass.  Lungs: Clear to auscultation bilaterally. Heart:  S1S2, no rubs, murmurs, gallops. Abdomen:  soft, non-tender, no hepatosplenomegaly, hernia, or mass and BS+.  Rectal: Female staff present - normal anoderm, anal wink present, normal resting tone, small rectocele, brown stool, no mass. Normal squeeze and simulated defecation with appropriate    abdominal contraction and descent Lymph:  no cervical or supraclavicular adenopathy. Extremities:   no edema Skin   no rash. Neuro:  A&O x 3.  Psych:  appropriate mood and  Affect.   Data Reviewed: 2005, 2008 egd's, colonoscopy, 2008 consult     Assessment & Plan:   1. Change in bowel habits  Ambulatory referral to Gastroenterology  2. GERD (gastroesophageal reflux disease)  Ambulatory referral to Gastroenterology  1. Most likely functional GI issues but ned to exclude other causes given 8 yrs since last colonoscopy and prior possible hx Barrett's esophagus 2. Will schedule EGD to make sure she does not have Barrett's esophagus or other GERD problems 3. Schedule colonoscopy to evaluate change in bowel habits The risks and benefits as well as alternatives of endoscopic procedure(s) have been discussed and reviewed. All questions answered. The patient agrees to proceed.  ON:GEXB,MWUXLKG Hessie Diener, MD

## 2012-12-13 DIAGNOSIS — D1801 Hemangioma of skin and subcutaneous tissue: Secondary | ICD-10-CM | POA: Diagnosis not present

## 2012-12-13 DIAGNOSIS — D239 Other benign neoplasm of skin, unspecified: Secondary | ICD-10-CM | POA: Diagnosis not present

## 2012-12-13 DIAGNOSIS — L821 Other seborrheic keratosis: Secondary | ICD-10-CM | POA: Diagnosis not present

## 2012-12-13 DIAGNOSIS — L819 Disorder of pigmentation, unspecified: Secondary | ICD-10-CM | POA: Diagnosis not present

## 2012-12-13 DIAGNOSIS — I789 Disease of capillaries, unspecified: Secondary | ICD-10-CM | POA: Diagnosis not present

## 2012-12-13 DIAGNOSIS — D2370 Other benign neoplasm of skin of unspecified lower limb, including hip: Secondary | ICD-10-CM | POA: Diagnosis not present

## 2013-01-17 ENCOUNTER — Encounter: Payer: Self-pay | Admitting: Internal Medicine

## 2013-01-17 ENCOUNTER — Ambulatory Visit (AMBULATORY_SURGERY_CENTER): Payer: Medicare Other | Admitting: Internal Medicine

## 2013-01-17 VITALS — BP 128/74 | HR 60 | Temp 98.3°F | Resp 24 | Ht 66.0 in | Wt 150.0 lb

## 2013-01-17 DIAGNOSIS — C50919 Malignant neoplasm of unspecified site of unspecified female breast: Secondary | ICD-10-CM | POA: Diagnosis not present

## 2013-01-17 DIAGNOSIS — K219 Gastro-esophageal reflux disease without esophagitis: Secondary | ICD-10-CM

## 2013-01-17 DIAGNOSIS — K2970 Gastritis, unspecified, without bleeding: Secondary | ICD-10-CM

## 2013-01-17 DIAGNOSIS — R198 Other specified symptoms and signs involving the digestive system and abdomen: Secondary | ICD-10-CM | POA: Diagnosis not present

## 2013-01-17 DIAGNOSIS — K2270 Barrett's esophagus without dysplasia: Secondary | ICD-10-CM | POA: Diagnosis not present

## 2013-01-17 DIAGNOSIS — K2990 Gastroduodenitis, unspecified, without bleeding: Secondary | ICD-10-CM

## 2013-01-17 MED ORDER — SODIUM CHLORIDE 0.9 % IV SOLN: 500.0000 mL | INTRAVENOUS | Status: DC

## 2013-01-17 NOTE — Op Note (Signed)
[facility] [address] [place], [address]   COLONOSCOPY PROCEDURE REPORT  PATIENT: Cain, Stacey  MR#: [id number] BIRTHDATE: February 23, 1946 , 66  yrs. old GENDER: Female ENDOSCOPIST: Iva Boop, MD, [facility] REFERRED BY: PROCEDURE DATE:  01/17/2013 PROCEDURE:   Colonoscopy, diagnostic ASA CLASS:   Class II INDICATIONS:Change in bowel habits. MEDICATIONS: There was residual sedation effect present from prior procedure, propofol (Diprivan) 150mg  IV, MAC sedation, administered by CRNA, and These medications were titrated to patient response per physician's verbal order  DESCRIPTION OF PROCEDURE:   After the risks benefits and alternatives of the procedure were thoroughly explained, informed consent was obtained.  A digital rectal exam revealed no abnormalities of the rectum.   The LB CF-H180AL [id number]  endoscope was introduced through the anus and advanced to the cecum, which was identified by both the appendix and ileocecal valve. No adverse events experienced.   The quality of the prep was Suprep excellent The instrument was then slowly withdrawn as the colon was fully examined.      COLON FINDINGS: A normal appearing cecum, ileocecal valve, and appendiceal orifice were identified.  The ascending, hepatic flexure, transverse, splenic flexure, descending, sigmoid colon and rectum appeared unremarkable.  No polyps or cancers were seen. Retroflexed views revealed no abnormalities. Photo was lost. The time to cecum=2 minutes 22 seconds.  Withdrawal time=7 minutes 57 seconds.  The scope was withdrawn and the procedure completed. COMPLICATIONS: There were no complications.  ENDOSCOPIC IMPRESSION: Normal colonoscopy excellent prep  RECOMMENDATIONS: 1.  Repeat Colonscopy in 10 years. 2.   Consider reducing fiber to alleviate soft stools.   eSigned:  Iva Boop, MD, [facility] 01/17/2013 11:20 AM   cc: Gildardo Cranker, MD and The Patient

## 2013-01-17 NOTE — Patient Instructions (Addendum)
I took biopsies where the esophagus and stomach meet to check for Barrett's esophagus and will let you know results and recommendations.  There is inflammation in the stomach  - gastritis - common and not necessarily a problem.  The colonoscopy was normal with a great prep. Next routine colonoscopy 10 years - 2024.  Maybe you are eating more fiber than needed causing softer stools - if you think so reduce fiber intake some.  Thank you for choosing me and [facility].  Iva Boop, MD, [facility]  Discharge instructions given with verbal understanding. Biopsies taken. Resume previous medications. YOU HAD AN ENDOSCOPIC PROCEDURE TODAY AT THE [facility]: Refer to the procedure report that was given to you for any specific questions about what was found during the examination.  If the procedure report does not answer your questions, please call your gastroenterologist to clarify.  If you requested that your care partner not be given the details of your procedure findings, then the procedure report has been included in a sealed envelope for you to review at your convenience later.  YOU SHOULD EXPECT: Some feelings of bloating in the abdomen. Passage of more gas than usual.  Walking can help get rid of the air that was put into your GI tract during the procedure and reduce the bloating. If you had a lower endoscopy (such as a colonoscopy or flexible sigmoidoscopy) you may notice spotting of blood in your stool or on the toilet paper. If you underwent a bowel prep for your procedure, then you may not have a normal bowel movement for a few days.  DIET: Your first meal following the procedure should be a light meal and then it is ok to progress to your normal diet.  A half-sandwich or bowl of soup is an example of a good first meal.  Heavy or fried foods are harder to digest and may make you feel nauseous or bloated.  Likewise meals heavy in dairy and vegetables can cause extra  gas to form and this can also increase the bloating.  Drink plenty of fluids but you should avoid alcoholic beverages for 24 hours.  ACTIVITY: Your care partner should take you home directly after the procedure.  You should plan to take it easy, moving slowly for the rest of the day.  You can resume normal activity the day after the procedure however you should NOT DRIVE or use heavy machinery for 24 hours (because of the sedation medicines used during the test).    SYMPTOMS TO REPORT IMMEDIATELY: A gastroenterologist can be reached at any hour.  During normal business hours, 8:30 AM to 5:00 PM Monday through Friday, call [phone].  After hours and on weekends, please call the GI answering service at [phone] who will take a message and have the physician on call contact you.   Following lower endoscopy (colonoscopy or flexible sigmoidoscopy):  Excessive amounts of blood in the stool  Significant tenderness or worsening of abdominal pains  Swelling of the abdomen that is new, acute  Fever of 100F or higher  Following upper endoscopy (EGD)  Vomiting of blood or coffee ground material  New chest pain or pain under the shoulder blades  Painful or persistently difficult swallowing  New shortness of breath  Fever of 100F or higher  Black, tarry-looking stools  FOLLOW UP: If any biopsies were taken you will be contacted by phone or by letter within the next 1-3 weeks.  Call your gastroenterologist if you  have not heard about the biopsies in 3 weeks.  Our staff will call the home number listed on your records the next business day following your procedure to check on you and address any questions or concerns that you may have at that time regarding the information given to you following your procedure. This is a courtesy call and so if there is no answer at the home number and we have not heard from you through the emergency physician on call, we will assume that you have returned to  your regular daily activities without incident.  SIGNATURES/CONFIDENTIALITY: You and/or your care partner have signed paperwork which will be entered into your electronic medical record.  These signatures attest to the fact that that the information above on your After Visit Summary has been reviewed and is understood.  Full responsibility of the confidentiality of this discharge information lies with you and/or your care-partner.

## 2013-01-17 NOTE — Progress Notes (Signed)
Called to room to assist during endoscopic procedure.  Patient ID and intended procedure confirmed with present staff. Received instructions for my participation in the procedure from the performing physician.  

## 2013-01-17 NOTE — Progress Notes (Signed)
Patient did not experience any of the following events: a burn prior to discharge; a fall within the facility; wrong site/side/patient/procedure/implant event; or a hospital transfer or hospital admission upon discharge from the facility. (G8907) Patient did not have preoperative order for IV antibiotic SSI prophylaxis. (G8918)  

## 2013-01-17 NOTE — Op Note (Addendum)
[facility] [address] [place], [address]   ENDOSCOPY PROCEDURE REPORT  PATIENT: Stacey Cain, Stacey Cain  MR#: [id number] BIRTHDATE: 04-21-1946 , 66  yrs. old GENDER: Female ENDOSCOPIST: Iva Boop, MD, [facility] PROCEDURE DATE:  01/17/2013 PROCEDURE:  Esophagoscopy  with biopsy ASA CLASS:     Class II INDICATIONS:  History of esophageal reflux and Barrett's MEDICATIONS: propofol (Diprivan) 150mg  IV, MAC sedation, Robinul 0.2 mg IV, administered by CRNA, and These medications were titrated to patient response per physician's verbal order TOPICAL ANESTHETIC: Cetacaine Spray  DESCRIPTION OF PROCEDURE: After the risks benefits and alternatives of the procedure were thoroughly explained, informed consent was obtained.  The LB GIF-H180 [id number] endoscope was introduced through the mouth and advanced to the stomach antrum. Without limitations. The instrument was slowly withdrawn as the mucosa was fully examined.        ESOPHAGUS: An irregular Z-line was observed 40 cm from the incisors. very slight changes. Multiple biopsies were performed using cold forceps given prior history and her concerns..  Sample sent for histology.  STOMACH: Mild gastropathy was found in the gastric antrum.  The remainder of the upper endoscopy exam was otherwise normal. Retroflexed views revealed no abnormalities.     The scope was then withdrawn from the patient and the procedure completed.  COMPLICATIONS: There were no complications. ENDOSCOPIC IMPRESSION: 1.   Irregular Z-line was observed 40 cm from the incisors; multiple biopsies 2.   Gastropathy was found in the gastric antrum 3.   The remainder of the upper endoscopy exam was otherwise normal  RECOMMENDATIONS: 1.  Office will call with results 2.  Proceed with a Colonoscopy.   eSigned:  Iva Boop, MD, [facility] 01/17/2013 11:16 AM Revised: 01/17/2013 11:16 AM  CC:The Patient and Gildardo Cranker MD

## 2013-01-20 ENCOUNTER — Other Ambulatory Visit: Payer: Self-pay | Admitting: Family Medicine

## 2013-01-20 ENCOUNTER — Telehealth: Payer: Self-pay | Admitting: *Deleted

## 2013-01-20 DIAGNOSIS — Z1231 Encounter for screening mammogram for malignant neoplasm of breast: Secondary | ICD-10-CM

## 2013-01-20 NOTE — Telephone Encounter (Signed)
  Follow up Call-  Call back number 01/17/2013  Post procedure Call Back phone  # [phone]  Permission to leave phone message Yes     Patient questions:  Do you have a fever, pain , or abdominal swelling? no Pain Score  0 *  Have you tolerated food without any problems? yes  Have you been able to return to your normal activities? yes  Do you have any questions about your discharge instructions: Diet   no Medications  no Follow up visit  no  Do you have questions or concerns about your Care? no  Actions: * If pain score is 4 or above: No action needed, pain <4.

## 2013-01-22 ENCOUNTER — Encounter: Payer: Self-pay | Admitting: Internal Medicine

## 2013-01-22 ENCOUNTER — Other Ambulatory Visit: Payer: Self-pay

## 2013-01-22 DIAGNOSIS — K219 Gastro-esophageal reflux disease without esophagitis: Secondary | ICD-10-CM | POA: Insufficient documentation

## 2013-01-22 MED ORDER — RANITIDINE HCL 150 MG PO CAPS: ORAL_CAPSULE | ORAL | Status: DC

## 2013-03-21 ENCOUNTER — Ambulatory Visit: Payer: Medicare Other

## 2013-03-28 ENCOUNTER — Ambulatory Visit
Admission: RE | Admit: 2013-03-28 | Discharge: 2013-03-28 | Disposition: A | Discharge status: Home / Self Care | Payer: Medicare Other | Source: Ambulatory Visit | Attending: Family Medicine | Admitting: Family Medicine

## 2013-03-28 DIAGNOSIS — Z1231 Encounter for screening mammogram for malignant neoplasm of breast: Secondary | ICD-10-CM | POA: Diagnosis not present

## 2013-04-01 ENCOUNTER — Other Ambulatory Visit: Payer: Self-pay | Admitting: Family Medicine

## 2013-04-01 DIAGNOSIS — R928 Other abnormal and inconclusive findings on diagnostic imaging of breast: Secondary | ICD-10-CM

## 2013-04-03 ENCOUNTER — Ambulatory Visit
Admission: RE | Admit: 2013-04-03 | Discharge: 2013-04-03 | Disposition: A | Discharge status: Home / Self Care | Payer: Medicare Other | Source: Ambulatory Visit | Attending: Family Medicine | Admitting: Family Medicine

## 2013-04-03 DIAGNOSIS — R928 Other abnormal and inconclusive findings on diagnostic imaging of breast: Secondary | ICD-10-CM

## 2013-06-11 DIAGNOSIS — M722 Plantar fascial fibromatosis: Secondary | ICD-10-CM | POA: Diagnosis not present

## 2013-06-19 DIAGNOSIS — M722 Plantar fascial fibromatosis: Secondary | ICD-10-CM | POA: Diagnosis not present

## 2013-06-25 DIAGNOSIS — H2510 Age-related nuclear cataract, unspecified eye: Secondary | ICD-10-CM | POA: Diagnosis not present

## 2013-06-25 DIAGNOSIS — H18599 Other hereditary corneal dystrophies, unspecified eye: Secondary | ICD-10-CM | POA: Diagnosis not present

## 2013-07-17 DIAGNOSIS — M722 Plantar fascial fibromatosis: Secondary | ICD-10-CM | POA: Diagnosis not present

## 2013-08-11 DIAGNOSIS — H18599 Other hereditary corneal dystrophies, unspecified eye: Secondary | ICD-10-CM | POA: Diagnosis not present

## 2013-08-11 DIAGNOSIS — H2510 Age-related nuclear cataract, unspecified eye: Secondary | ICD-10-CM | POA: Diagnosis not present

## 2013-09-02 ENCOUNTER — Other Ambulatory Visit: Payer: Self-pay | Admitting: Family Medicine

## 2013-09-02 DIAGNOSIS — N63 Unspecified lump in unspecified breast: Secondary | ICD-10-CM

## 2013-09-08 ENCOUNTER — Encounter: Payer: Self-pay | Admitting: Podiatry

## 2013-09-08 ENCOUNTER — Ambulatory Visit (INDEPENDENT_AMBULATORY_CARE_PROVIDER_SITE_OTHER): Payer: Medicare Other | Admitting: Podiatry

## 2013-09-08 VITALS — BP 124/78 | HR 62 | Resp 12 | Ht 66.0 in | Wt 155.0 lb

## 2013-09-08 DIAGNOSIS — M722 Plantar fascial fibromatosis: Secondary | ICD-10-CM | POA: Diagnosis not present

## 2013-09-08 MED ORDER — TRIAMCINOLONE ACETONIDE 10 MG/ML IJ SUSP
5.0000 mg | Freq: Once | INTRAMUSCULAR | Status: AC
  Administered 2013-09-08: 5 mg via INTRA_ARTICULAR

## 2013-09-08 NOTE — Patient Instructions (Signed)
We will contact you when orthotics are available

## 2013-09-08 NOTE — Progress Notes (Signed)
Subjective:     Patient ID: Stacey Cain, female   DOB: 1946-02-15, 67 y.o.   MRN: [id number]  HPI patient's heel is in a slightly different area and noted to be more proximal states it hurts when she tries to do activity   Review of Systems  All other systems reviewed and are negative.       Objective:   Physical Exam  Nursing note and vitals reviewed. Cardiovascular: Intact distal pulses.   Musculoskeletal: Normal range of motion.  Neurological: She is alert.  Skin: Skin is warm.   pain to the plantar right heel noted more proximal than previous     Assessment:     Improving plantar fasciitis right heel plantar surface but still present    Plan:     Reinjected in a more proximal direction 3 mg Kenalog 5 mg Xylocaine Marcaine mixture and scanned for new orthotics due to the fact that her old ones are losing her ability to support her

## 2013-09-10 ENCOUNTER — Ambulatory Visit: Payer: Self-pay | Admitting: Podiatry

## 2013-09-15 ENCOUNTER — Ambulatory Visit (INDEPENDENT_AMBULATORY_CARE_PROVIDER_SITE_OTHER): Payer: Medicare Other | Admitting: Podiatry

## 2013-09-15 DIAGNOSIS — M722 Plantar fascial fibromatosis: Secondary | ICD-10-CM | POA: Diagnosis not present

## 2013-09-15 NOTE — Progress Notes (Signed)
  Subjective:    Patient ID: Stacey Cain, female    DOB: 23-Sep-1946, 67 y.o.   MRN: [id number]  HPI    Review of Systems     Objective:   Physical Exam        Assessment & Plan:

## 2013-09-15 NOTE — Progress Notes (Signed)
Subjective:     Patient ID: Stacey Cain, female   DOB: 23-Jul-1946, 67 y.o.   MRN: [id number]  HPI patient states her left heel is still very sore. Does not feel that responded to the cortisone well. Very hard for her to ambulate currently   Review of Systems  All other systems reviewed and are negative.       Objective:   Physical Exam  Nursing note and vitals reviewed. Cardiovascular: Intact distal pulses.   Musculoskeletal: Normal range of motion.  Neurological: She is alert.  Skin: Skin is warm.   Plantar aspect left heel in the central and at the insertion to the calcaneus is very tender when pressed. No other pathology noted    Assessment:     Acute plantar fasciitis left central band insertional    Plan:     H&P done condition discussed. I have recommended immobilization and dispensed air fracture walker with instructions on complete reduction of pressure against the plantar heel

## 2013-09-15 NOTE — Patient Instructions (Signed)
Boot full time for 2 weeks and then reduce

## 2013-09-17 DIAGNOSIS — Z23 Encounter for immunization: Secondary | ICD-10-CM | POA: Diagnosis not present

## 2013-10-06 ENCOUNTER — Encounter: Payer: Self-pay | Admitting: Podiatry

## 2013-10-06 ENCOUNTER — Ambulatory Visit (INDEPENDENT_AMBULATORY_CARE_PROVIDER_SITE_OTHER): Payer: Medicare Other | Admitting: Podiatry

## 2013-10-06 VITALS — BP 133/79 | HR 71 | Resp 16

## 2013-10-06 DIAGNOSIS — M722 Plantar fascial fibromatosis: Secondary | ICD-10-CM

## 2013-10-06 NOTE — Progress Notes (Signed)
Subjective:     Patient ID: Stacey Cain, female   DOB: [birth year], 67 y.o.   MRN: [id number]  HPI patient states my heel is still hurting be quite a bit. Stated when she wore the boot it did feel some better but it continues to give her trouble   Review of Systems     Objective:   Physical Exam  Nursing note and vitals reviewed. Constitutional: She is oriented to person, place, and time.  Cardiovascular: Intact distal pulses.   Musculoskeletal: Normal range of motion.  Neurological: She is oriented to person, place, and time.  Skin: Skin is warm.   patient states that upon palpation her heel is still very tender here I noted the pain to be on the medial side but also the central lateral plantar aspect of the heel     Assessment:     Plan her fasciitis affecting the entire heel left    Plan:     Discussed options and we have decided on E. Pat treatment. I educated her on this and scheduled for next week for her first treatment

## 2013-10-10 ENCOUNTER — Ambulatory Visit
Admission: RE | Admit: 2013-10-10 | Discharge: 2013-10-10 | Disposition: A | Discharge status: Home / Self Care | Payer: Medicare Other | Source: Ambulatory Visit | Attending: Family Medicine | Admitting: Family Medicine

## 2013-10-10 DIAGNOSIS — N63 Unspecified lump in unspecified breast: Secondary | ICD-10-CM

## 2013-10-10 DIAGNOSIS — N6489 Other specified disorders of breast: Secondary | ICD-10-CM | POA: Diagnosis not present

## 2013-10-13 ENCOUNTER — Ambulatory Visit: Payer: Medicare Other | Admitting: Podiatry

## 2013-12-05 DIAGNOSIS — M949 Disorder of cartilage, unspecified: Secondary | ICD-10-CM | POA: Diagnosis not present

## 2013-12-05 DIAGNOSIS — R300 Dysuria: Secondary | ICD-10-CM | POA: Diagnosis not present

## 2013-12-05 DIAGNOSIS — Z01419 Encounter for gynecological examination (general) (routine) without abnormal findings: Secondary | ICD-10-CM | POA: Diagnosis not present

## 2013-12-05 DIAGNOSIS — M899 Disorder of bone, unspecified: Secondary | ICD-10-CM | POA: Diagnosis not present

## 2013-12-05 DIAGNOSIS — N952 Postmenopausal atrophic vaginitis: Secondary | ICD-10-CM | POA: Diagnosis not present

## 2013-12-05 DIAGNOSIS — Z124 Encounter for screening for malignant neoplasm of cervix: Secondary | ICD-10-CM | POA: Diagnosis not present

## 2013-12-05 DIAGNOSIS — Z853 Personal history of malignant neoplasm of breast: Secondary | ICD-10-CM | POA: Diagnosis not present

## 2013-12-05 DIAGNOSIS — Z8041 Family history of malignant neoplasm of ovary: Secondary | ICD-10-CM | POA: Diagnosis not present

## 2013-12-19 DIAGNOSIS — D239 Other benign neoplasm of skin, unspecified: Secondary | ICD-10-CM | POA: Diagnosis not present

## 2013-12-19 DIAGNOSIS — L819 Disorder of pigmentation, unspecified: Secondary | ICD-10-CM | POA: Diagnosis not present

## 2013-12-19 DIAGNOSIS — L821 Other seborrheic keratosis: Secondary | ICD-10-CM | POA: Diagnosis not present

## 2013-12-19 DIAGNOSIS — L723 Sebaceous cyst: Secondary | ICD-10-CM | POA: Diagnosis not present

## 2013-12-19 DIAGNOSIS — D1801 Hemangioma of skin and subcutaneous tissue: Secondary | ICD-10-CM | POA: Diagnosis not present

## 2013-12-19 DIAGNOSIS — L219 Seborrheic dermatitis, unspecified: Secondary | ICD-10-CM | POA: Diagnosis not present

## 2014-01-02 DIAGNOSIS — M899 Disorder of bone, unspecified: Secondary | ICD-10-CM | POA: Diagnosis not present

## 2014-01-02 DIAGNOSIS — M79609 Pain in unspecified limb: Secondary | ICD-10-CM | POA: Diagnosis not present

## 2014-01-02 DIAGNOSIS — M949 Disorder of cartilage, unspecified: Secondary | ICD-10-CM | POA: Diagnosis not present

## 2014-01-06 ENCOUNTER — Other Ambulatory Visit: Payer: Self-pay | Admitting: Obstetrics and Gynecology

## 2014-01-15 ENCOUNTER — Other Ambulatory Visit: Payer: Self-pay | Admitting: Obstetrics and Gynecology

## 2014-01-15 DIAGNOSIS — M8580 Other specified disorders of bone density and structure, unspecified site: Secondary | ICD-10-CM

## 2014-01-16 DIAGNOSIS — E559 Vitamin D deficiency, unspecified: Secondary | ICD-10-CM | POA: Diagnosis not present

## 2014-01-19 DIAGNOSIS — M79609 Pain in unspecified limb: Secondary | ICD-10-CM | POA: Diagnosis not present

## 2014-02-05 DIAGNOSIS — M899 Disorder of bone, unspecified: Secondary | ICD-10-CM | POA: Diagnosis not present

## 2014-02-05 DIAGNOSIS — M949 Disorder of cartilage, unspecified: Secondary | ICD-10-CM | POA: Diagnosis not present

## 2014-02-13 DIAGNOSIS — M79609 Pain in unspecified limb: Secondary | ICD-10-CM | POA: Diagnosis not present

## 2014-03-03 ENCOUNTER — Other Ambulatory Visit: Payer: Self-pay | Admitting: Family Medicine

## 2014-03-03 DIAGNOSIS — N6489 Other specified disorders of breast: Secondary | ICD-10-CM

## 2014-04-03 ENCOUNTER — Encounter (INDEPENDENT_AMBULATORY_CARE_PROVIDER_SITE_OTHER): Payer: Self-pay

## 2014-04-03 ENCOUNTER — Ambulatory Visit
Admission: RE | Admit: 2014-04-03 | Discharge: 2014-04-03 | Disposition: A | Discharge status: Home / Self Care | Payer: Medicare Other | Source: Ambulatory Visit | Attending: Family Medicine | Admitting: Family Medicine

## 2014-04-03 DIAGNOSIS — N6489 Other specified disorders of breast: Secondary | ICD-10-CM

## 2014-04-03 DIAGNOSIS — R922 Inconclusive mammogram: Secondary | ICD-10-CM | POA: Diagnosis not present

## 2014-04-03 DIAGNOSIS — Z853 Personal history of malignant neoplasm of breast: Secondary | ICD-10-CM | POA: Diagnosis not present

## 2014-04-08 DIAGNOSIS — H2510 Age-related nuclear cataract, unspecified eye: Secondary | ICD-10-CM | POA: Diagnosis not present

## 2014-04-08 DIAGNOSIS — H18599 Other hereditary corneal dystrophies, unspecified eye: Secondary | ICD-10-CM | POA: Diagnosis not present

## 2014-04-29 DIAGNOSIS — H2510 Age-related nuclear cataract, unspecified eye: Secondary | ICD-10-CM | POA: Diagnosis not present

## 2014-04-29 DIAGNOSIS — H18599 Other hereditary corneal dystrophies, unspecified eye: Secondary | ICD-10-CM | POA: Diagnosis not present

## 2014-05-07 DIAGNOSIS — Z9889 Other specified postprocedural states: Secondary | ICD-10-CM | POA: Diagnosis not present

## 2014-05-07 DIAGNOSIS — Z8719 Personal history of other diseases of the digestive system: Secondary | ICD-10-CM | POA: Diagnosis not present

## 2014-05-07 DIAGNOSIS — Z853 Personal history of malignant neoplasm of breast: Secondary | ICD-10-CM | POA: Diagnosis not present

## 2014-05-07 DIAGNOSIS — K219 Gastro-esophageal reflux disease without esophagitis: Secondary | ICD-10-CM | POA: Diagnosis not present

## 2014-05-07 DIAGNOSIS — J309 Allergic rhinitis, unspecified: Secondary | ICD-10-CM | POA: Diagnosis not present

## 2014-05-07 DIAGNOSIS — H2589 Other age-related cataract: Secondary | ICD-10-CM | POA: Diagnosis not present

## 2014-05-07 DIAGNOSIS — Z90710 Acquired absence of both cervix and uterus: Secondary | ICD-10-CM | POA: Diagnosis not present

## 2014-05-19 DIAGNOSIS — H2589 Other age-related cataract: Secondary | ICD-10-CM | POA: Diagnosis not present

## 2014-05-19 DIAGNOSIS — Z923 Personal history of irradiation: Secondary | ICD-10-CM | POA: Diagnosis not present

## 2014-05-19 DIAGNOSIS — Z87891 Personal history of nicotine dependence: Secondary | ICD-10-CM | POA: Diagnosis not present

## 2014-05-19 DIAGNOSIS — Z79899 Other long term (current) drug therapy: Secondary | ICD-10-CM | POA: Diagnosis not present

## 2014-05-19 DIAGNOSIS — J309 Allergic rhinitis, unspecified: Secondary | ICD-10-CM | POA: Diagnosis not present

## 2014-05-19 DIAGNOSIS — Z853 Personal history of malignant neoplasm of breast: Secondary | ICD-10-CM | POA: Diagnosis not present

## 2014-05-19 DIAGNOSIS — K2270 Barrett's esophagus without dysplasia: Secondary | ICD-10-CM | POA: Diagnosis not present

## 2014-05-19 DIAGNOSIS — E559 Vitamin D deficiency, unspecified: Secondary | ICD-10-CM | POA: Diagnosis not present

## 2014-05-19 DIAGNOSIS — K219 Gastro-esophageal reflux disease without esophagitis: Secondary | ICD-10-CM | POA: Diagnosis not present

## 2014-05-19 DIAGNOSIS — Z90710 Acquired absence of both cervix and uterus: Secondary | ICD-10-CM | POA: Diagnosis not present

## 2014-05-19 DIAGNOSIS — Z9079 Acquired absence of other genital organ(s): Secondary | ICD-10-CM | POA: Diagnosis not present

## 2014-05-20 DIAGNOSIS — Z961 Presence of intraocular lens: Secondary | ICD-10-CM | POA: Diagnosis not present

## 2014-08-21 DIAGNOSIS — Z136 Encounter for screening for cardiovascular disorders: Secondary | ICD-10-CM | POA: Diagnosis not present

## 2014-08-21 DIAGNOSIS — M899 Disorder of bone, unspecified: Secondary | ICD-10-CM | POA: Diagnosis not present

## 2014-08-21 DIAGNOSIS — Z0000 Encounter for general adult medical examination without abnormal findings: Secondary | ICD-10-CM | POA: Diagnosis not present

## 2014-08-21 DIAGNOSIS — Z23 Encounter for immunization: Secondary | ICD-10-CM | POA: Diagnosis not present

## 2014-12-25 DIAGNOSIS — D2272 Melanocytic nevi of left lower limb, including hip: Secondary | ICD-10-CM | POA: Diagnosis not present

## 2014-12-25 DIAGNOSIS — D225 Melanocytic nevi of trunk: Secondary | ICD-10-CM | POA: Diagnosis not present

## 2014-12-25 DIAGNOSIS — L812 Freckles: Secondary | ICD-10-CM | POA: Diagnosis not present

## 2014-12-25 DIAGNOSIS — L821 Other seborrheic keratosis: Secondary | ICD-10-CM | POA: Diagnosis not present

## 2015-01-01 DIAGNOSIS — Z01419 Encounter for gynecological examination (general) (routine) without abnormal findings: Secondary | ICD-10-CM | POA: Diagnosis not present

## 2015-01-01 DIAGNOSIS — Z124 Encounter for screening for malignant neoplasm of cervix: Secondary | ICD-10-CM | POA: Diagnosis not present

## 2015-01-08 DIAGNOSIS — E780 Pure hypercholesterolemia: Secondary | ICD-10-CM | POA: Diagnosis not present

## 2015-01-27 DIAGNOSIS — H26493 Other secondary cataract, bilateral: Secondary | ICD-10-CM | POA: Diagnosis not present

## 2015-01-27 DIAGNOSIS — Z961 Presence of intraocular lens: Secondary | ICD-10-CM | POA: Diagnosis not present

## 2015-03-25 ENCOUNTER — Other Ambulatory Visit: Payer: Self-pay

## 2015-03-25 DIAGNOSIS — Z1231 Encounter for screening mammogram for malignant neoplasm of breast: Secondary | ICD-10-CM

## 2015-04-06 ENCOUNTER — Ambulatory Visit
Admission: RE | Admit: 2015-04-06 | Discharge: 2015-04-06 | Disposition: A | Discharge status: Home / Self Care | Payer: Medicare Other | Source: Ambulatory Visit

## 2015-04-06 DIAGNOSIS — Z1231 Encounter for screening mammogram for malignant neoplasm of breast: Secondary | ICD-10-CM | POA: Diagnosis not present

## 2015-09-10 DIAGNOSIS — M8580 Other specified disorders of bone density and structure, unspecified site: Secondary | ICD-10-CM | POA: Diagnosis not present

## 2015-09-10 DIAGNOSIS — D649 Anemia, unspecified: Secondary | ICD-10-CM | POA: Diagnosis not present

## 2015-09-10 DIAGNOSIS — Z23 Encounter for immunization: Secondary | ICD-10-CM | POA: Diagnosis not present

## 2015-09-10 DIAGNOSIS — Z131 Encounter for screening for diabetes mellitus: Secondary | ICD-10-CM | POA: Diagnosis not present

## 2015-09-10 DIAGNOSIS — Z0000 Encounter for general adult medical examination without abnormal findings: Secondary | ICD-10-CM | POA: Diagnosis not present

## 2015-09-10 DIAGNOSIS — E7800 Pure hypercholesterolemia, unspecified: Secondary | ICD-10-CM | POA: Diagnosis not present

## 2015-12-31 DIAGNOSIS — L905 Scar conditions and fibrosis of skin: Secondary | ICD-10-CM | POA: Diagnosis not present

## 2015-12-31 DIAGNOSIS — L821 Other seborrheic keratosis: Secondary | ICD-10-CM | POA: Diagnosis not present

## 2015-12-31 DIAGNOSIS — D485 Neoplasm of uncertain behavior of skin: Secondary | ICD-10-CM | POA: Diagnosis not present

## 2015-12-31 DIAGNOSIS — L859 Epidermal thickening, unspecified: Secondary | ICD-10-CM | POA: Diagnosis not present

## 2015-12-31 DIAGNOSIS — D1801 Hemangioma of skin and subcutaneous tissue: Secondary | ICD-10-CM | POA: Diagnosis not present

## 2016-03-22 ENCOUNTER — Other Ambulatory Visit: Payer: Self-pay

## 2016-03-22 DIAGNOSIS — Z1231 Encounter for screening mammogram for malignant neoplasm of breast: Secondary | ICD-10-CM

## 2016-04-11 ENCOUNTER — Ambulatory Visit
Admission: RE | Admit: 2016-04-11 | Discharge: 2016-04-11 | Disposition: A | Discharge status: Home / Self Care | Payer: Medicare Other | Source: Ambulatory Visit

## 2016-04-11 DIAGNOSIS — Z1231 Encounter for screening mammogram for malignant neoplasm of breast: Secondary | ICD-10-CM

## 2016-06-09 DIAGNOSIS — H52223 Regular astigmatism, bilateral: Secondary | ICD-10-CM | POA: Diagnosis not present

## 2016-06-09 DIAGNOSIS — Z9849 Cataract extraction status, unspecified eye: Secondary | ICD-10-CM | POA: Diagnosis not present

## 2016-06-09 DIAGNOSIS — H5213 Myopia, bilateral: Secondary | ICD-10-CM | POA: Diagnosis not present

## 2016-06-09 DIAGNOSIS — Z961 Presence of intraocular lens: Secondary | ICD-10-CM | POA: Diagnosis not present

## 2016-06-09 DIAGNOSIS — H524 Presbyopia: Secondary | ICD-10-CM | POA: Diagnosis not present

## 2016-06-09 DIAGNOSIS — H04123 Dry eye syndrome of bilateral lacrimal glands: Secondary | ICD-10-CM | POA: Diagnosis not present

## 2016-08-07 DIAGNOSIS — Z23 Encounter for immunization: Secondary | ICD-10-CM | POA: Diagnosis not present

## 2016-08-11 DIAGNOSIS — Z01419 Encounter for gynecological examination (general) (routine) without abnormal findings: Secondary | ICD-10-CM | POA: Diagnosis not present

## 2016-08-11 DIAGNOSIS — Z124 Encounter for screening for malignant neoplasm of cervix: Secondary | ICD-10-CM | POA: Diagnosis not present

## 2016-08-11 DIAGNOSIS — Z8041 Family history of malignant neoplasm of ovary: Secondary | ICD-10-CM | POA: Diagnosis not present

## 2016-09-13 DIAGNOSIS — Z853 Personal history of malignant neoplasm of breast: Secondary | ICD-10-CM | POA: Diagnosis not present

## 2016-09-13 DIAGNOSIS — Z8041 Family history of malignant neoplasm of ovary: Secondary | ICD-10-CM | POA: Diagnosis not present

## 2016-10-13 DIAGNOSIS — Z131 Encounter for screening for diabetes mellitus: Secondary | ICD-10-CM | POA: Diagnosis not present

## 2016-10-13 DIAGNOSIS — Z0000 Encounter for general adult medical examination without abnormal findings: Secondary | ICD-10-CM | POA: Diagnosis not present

## 2016-10-13 DIAGNOSIS — M8580 Other specified disorders of bone density and structure, unspecified site: Secondary | ICD-10-CM | POA: Diagnosis not present

## 2016-10-13 DIAGNOSIS — E7800 Pure hypercholesterolemia, unspecified: Secondary | ICD-10-CM | POA: Diagnosis not present

## 2016-10-13 DIAGNOSIS — D649 Anemia, unspecified: Secondary | ICD-10-CM | POA: Diagnosis not present

## 2016-12-06 DIAGNOSIS — M8588 Other specified disorders of bone density and structure, other site: Secondary | ICD-10-CM | POA: Diagnosis not present

## 2016-12-06 DIAGNOSIS — M810 Age-related osteoporosis without current pathological fracture: Secondary | ICD-10-CM | POA: Diagnosis not present

## 2016-12-25 DIAGNOSIS — M810 Age-related osteoporosis without current pathological fracture: Secondary | ICD-10-CM | POA: Diagnosis not present

## 2016-12-25 DIAGNOSIS — Z808 Family history of malignant neoplasm of other organs or systems: Secondary | ICD-10-CM | POA: Diagnosis not present

## 2016-12-25 DIAGNOSIS — Z8042 Family history of malignant neoplasm of prostate: Secondary | ICD-10-CM | POA: Diagnosis not present

## 2016-12-25 DIAGNOSIS — Z853 Personal history of malignant neoplasm of breast: Secondary | ICD-10-CM | POA: Diagnosis not present

## 2016-12-25 DIAGNOSIS — Z8041 Family history of malignant neoplasm of ovary: Secondary | ICD-10-CM | POA: Diagnosis not present

## 2017-01-12 DIAGNOSIS — D2272 Melanocytic nevi of left lower limb, including hip: Secondary | ICD-10-CM | POA: Diagnosis not present

## 2017-01-12 DIAGNOSIS — L812 Freckles: Secondary | ICD-10-CM | POA: Diagnosis not present

## 2017-01-12 DIAGNOSIS — L821 Other seborrheic keratosis: Secondary | ICD-10-CM | POA: Diagnosis not present

## 2017-01-12 DIAGNOSIS — D225 Melanocytic nevi of trunk: Secondary | ICD-10-CM | POA: Diagnosis not present

## 2017-01-12 DIAGNOSIS — D2271 Melanocytic nevi of right lower limb, including hip: Secondary | ICD-10-CM | POA: Diagnosis not present

## 2017-02-11 DIAGNOSIS — J040 Acute laryngitis: Secondary | ICD-10-CM | POA: Diagnosis not present

## 2017-02-28 DIAGNOSIS — J019 Acute sinusitis, unspecified: Secondary | ICD-10-CM | POA: Diagnosis not present

## 2017-04-02 ENCOUNTER — Other Ambulatory Visit: Payer: Self-pay | Admitting: Family Medicine

## 2017-04-02 DIAGNOSIS — Z1231 Encounter for screening mammogram for malignant neoplasm of breast: Secondary | ICD-10-CM

## 2017-04-18 ENCOUNTER — Ambulatory Visit
Admission: RE | Admit: 2017-04-18 | Discharge: 2017-04-18 | Disposition: A | Discharge status: Home / Self Care | Payer: Medicare Other | Source: Ambulatory Visit | Attending: Family Medicine | Admitting: Family Medicine

## 2017-04-18 DIAGNOSIS — Z1231 Encounter for screening mammogram for malignant neoplasm of breast: Secondary | ICD-10-CM

## 2017-09-20 DIAGNOSIS — N3281 Overactive bladder: Secondary | ICD-10-CM | POA: Diagnosis not present

## 2017-09-20 DIAGNOSIS — Z124 Encounter for screening for malignant neoplasm of cervix: Secondary | ICD-10-CM | POA: Diagnosis not present

## 2017-09-20 DIAGNOSIS — Z8041 Family history of malignant neoplasm of ovary: Secondary | ICD-10-CM | POA: Diagnosis not present

## 2017-09-20 DIAGNOSIS — Z01419 Encounter for gynecological examination (general) (routine) without abnormal findings: Secondary | ICD-10-CM | POA: Diagnosis not present

## 2017-09-20 DIAGNOSIS — M810 Age-related osteoporosis without current pathological fracture: Secondary | ICD-10-CM | POA: Diagnosis not present

## 2017-09-28 DIAGNOSIS — N3281 Overactive bladder: Secondary | ICD-10-CM | POA: Diagnosis not present

## 2017-09-28 DIAGNOSIS — Z8041 Family history of malignant neoplasm of ovary: Secondary | ICD-10-CM | POA: Diagnosis not present

## 2017-10-05 DIAGNOSIS — D225 Melanocytic nevi of trunk: Secondary | ICD-10-CM | POA: Diagnosis not present

## 2017-10-05 DIAGNOSIS — L821 Other seborrheic keratosis: Secondary | ICD-10-CM | POA: Diagnosis not present

## 2017-11-09 ENCOUNTER — Telehealth: Payer: Self-pay | Admitting: Genetics

## 2017-11-09 NOTE — Telephone Encounter (Signed)
11/09/17 called patient @ 2:20pm to confirm appointment for 12/25/17 @ 1:00 with Stacey Cain in [place].  Patient is aware to arrive 30 minutes prior to appointment to register.

## 2017-11-12 DIAGNOSIS — D649 Anemia, unspecified: Secondary | ICD-10-CM | POA: Diagnosis not present

## 2017-11-12 DIAGNOSIS — Z0000 Encounter for general adult medical examination without abnormal findings: Secondary | ICD-10-CM | POA: Diagnosis not present

## 2017-11-12 DIAGNOSIS — E7800 Pure hypercholesterolemia, unspecified: Secondary | ICD-10-CM | POA: Diagnosis not present

## 2017-11-12 DIAGNOSIS — Z131 Encounter for screening for diabetes mellitus: Secondary | ICD-10-CM | POA: Diagnosis not present

## 2017-11-12 DIAGNOSIS — M810 Age-related osteoporosis without current pathological fracture: Secondary | ICD-10-CM | POA: Diagnosis not present

## 2017-11-15 DIAGNOSIS — H5213 Myopia, bilateral: Secondary | ICD-10-CM | POA: Diagnosis not present

## 2017-11-15 DIAGNOSIS — Z9849 Cataract extraction status, unspecified eye: Secondary | ICD-10-CM | POA: Diagnosis not present

## 2017-11-15 DIAGNOSIS — H1045 Other chronic allergic conjunctivitis: Secondary | ICD-10-CM | POA: Diagnosis not present

## 2017-11-15 DIAGNOSIS — H52222 Regular astigmatism, left eye: Secondary | ICD-10-CM | POA: Diagnosis not present

## 2017-11-15 DIAGNOSIS — Z961 Presence of intraocular lens: Secondary | ICD-10-CM | POA: Diagnosis not present

## 2017-11-15 DIAGNOSIS — H524 Presbyopia: Secondary | ICD-10-CM | POA: Diagnosis not present

## 2017-12-25 ENCOUNTER — Encounter: Payer: Self-pay | Admitting: Genetics

## 2017-12-25 ENCOUNTER — Inpatient Hospital Stay: Payer: Medicare Other | Attending: Genetic Counselor | Admitting: Genetics

## 2017-12-25 ENCOUNTER — Inpatient Hospital Stay: Payer: Medicare Other

## 2017-12-25 DIAGNOSIS — Z8042 Family history of malignant neoplasm of prostate: Secondary | ICD-10-CM | POA: Diagnosis not present

## 2017-12-25 DIAGNOSIS — Z806 Family history of leukemia: Secondary | ICD-10-CM

## 2017-12-25 DIAGNOSIS — Z853 Personal history of malignant neoplasm of breast: Secondary | ICD-10-CM

## 2017-12-25 DIAGNOSIS — Z800 Family history of malignant neoplasm of digestive organs: Secondary | ICD-10-CM | POA: Insufficient documentation

## 2017-12-25 DIAGNOSIS — Z8041 Family history of malignant neoplasm of ovary: Secondary | ICD-10-CM | POA: Insufficient documentation

## 2017-12-25 DIAGNOSIS — Z808 Family history of malignant neoplasm of other organs or systems: Secondary | ICD-10-CM | POA: Diagnosis not present

## 2017-12-25 DIAGNOSIS — Z315 Encounter for genetic counseling: Secondary | ICD-10-CM

## 2017-12-25 DIAGNOSIS — C50919 Malignant neoplasm of unspecified site of unspecified female breast: Secondary | ICD-10-CM

## 2017-12-25 HISTORY — DX: Personal history of malignant neoplasm of breast: Z85.3

## 2017-12-25 HISTORY — DX: Malignant neoplasm of unspecified site of unspecified female breast: C50.919

## 2017-12-25 NOTE — Progress Notes (Signed)
REFERRING PROVIDER: Avon Gully, NP [address] [place], [place] [address]  PRIMARY PROVIDER:  Lawerance Cruel, MD  PRIMARY REASON FOR VISIT:  1. Family history of ovarian cancer   2. Family history of pancreatic cancer   3. Family history of prostate cancer   4. Personal history of breast cancer     HISTORY OF PRESENT ILLNESS:   Stacey Cain, a 72 y.o. female, was seen for a [facility] cancer genetics consultation at the request of Dr. Orene Desanctis due to a personal and family history of cancer.  Stacey Cain presents to clinic today to discuss the possibility of a hereditary predisposition to cancer, genetic testing, and to further clarify her future cancer risks, as well as potential cancer risks for family members.   In 2004, at the age of 24, Stacey Cain was diagnosed with right sided breast cancer that was reportedly ER+.  She reports having lumpectomy followed by radiation.  She reports that no nodes were involved.  CANCER HISTORY:   No history exists.    HORMONAL RISK FACTORS:  First live birth at age 7.  Ovaries intact: yes. 1 intact, one removed.  Is getting ultrasound and CA-125 screening currently. Hysterectomy: yes. At age 48 due to fibroids.  Menopausal status: postmenopausal.  Colonoscopy: yes; reportedly normal.   Past Medical History:  Diagnosis Date  . Allergy   . Barrett esophagus 2007   2010 and 2014  NO BARRETT'S  . Breast cancer ([facility]) 12/2002  . Family history of ovarian cancer   . Family history of pancreatic cancer   . Family history of prostate cancer   . Gastritis   . GERD (gastroesophageal reflux disease)   . Hyperlipidemia   . Iron deficiency anemia   . Malignant neoplasm of breast (female) ([facility]) 12/25/2017  . Osteopenia   . Personal history of breast cancer 12/25/2017  . S/P radiation therapy     Past Surgical History:  Procedure Laterality Date  . ABDOMINAL HYSTERECTOMY  1984   unilateral oophorectomy  . BREAST BIOPSY     benign stereo  biopsy left 2007  . BREAST LUMPECTOMY     right breast2004  . CESAREAN SECTION    . COLONOSCOPY    . COLONOSCOPY    . ESOPHAGOGASTRODUODENOSCOPY  multiple  . OVARIAN CYST REMOVAL     x2  . right breast milk duct     benign  . right breat lumpectomy  2004  . UPPER GASTROINTESTINAL ENDOSCOPY      Social History   Socioeconomic History  . Marital status: Married    Spouse name: None  . Number of children: 2  . Years of education: None  . Highest education level: None  Social Needs  . Financial resource strain: None  . Food insecurity - worry: None  . Food insecurity - inability: None  . Transportation needs - medical: None  . Transportation needs - non-medical: None  Occupational History  . Occupation: Herbalist  Tobacco Use  . Smoking status: Former Research scientist (life sciences)  . Smokeless tobacco: Never Used  Substance and Sexual Activity  . Alcohol use: No  . Drug use: No  . Sexual activity: None  Other Topics Concern  . None  Social History Narrative   Married, Herbalist   2 sons     FAMILY HISTORY:  We obtained a detailed, 4-generation family history.  Significant diagnoses are listed below: Family History  Problem Relation Age of Onset  . Prostate cancer Father 2  prostate removed  . Skin cancer Father   . Ovarian cancer Mother 22  . Prostate cancer Brother 61       active surveillance- had a few biopsies   . Leukemia Maternal Uncle 66  . Pancreatic cancer Paternal Grandmother 70  . Colon cancer Neg Hx   . Esophageal cancer Neg Hx   . Rectal cancer Neg Hx   . Stomach cancer Neg Hx    Stacey Cain has 2 sons ages 85 and 31 with no history of cancer.  Her oldest son has a son and a daughter. Her youngest son has 2 daughters and 2 sons. Her grandchildren are ages 74-23.    Stacey Cain has 2 brothers.  One was diagnosed with prostate cancer at 56 and is just being watched.  He is now 36.  This brother has 3 daughters. Her other brother is 70 with no history of  cancer.  He has a daughter.    Stacey Cain father was diagnosed with prostate cancer at 59 and had his prostate removed.  He died at 20.  Stacey Cain has 2 paternal uncles and 5 paternal aunts with no known history of cancer, although one paternal aunt might have had leukemia, but she is not sure.  Stacey Cain has several paternal cousins, none with any known history of cancer.  She reports she is very close and keeps in good contact with her cousins. Stacey Cain paternal grandmother died in her 46's due to heart diease and her paternal grandmother died in her 92's due to pancreatic cancer diagnosed in her 23's.  She reports heart disease and parkinson's runs on her father's side of the family.   Stacey Cain mother was diagnosed with ovarian cancer at 16 and died in her 72's. Stacey Cain has a maternal uncle who died of leukemia in his 42's.  She has another maternal uncle who lived to 75 with no history of cancer.  She also has a maternal aunt who lived to 32 with no history of cancer.  Her maternal grandparents lived into their 85's with no history of cancer.  Stacey Cain has several maternal cousins with no history of cancer (she is close/good contact with these reltaives).   Stacey Cain is unaware of previous family history of genetic testing for hereditary cancer risks. Patient's maternal ancestors are of N. European descent, and paternal ancestors are of N. European descent. There is no reported Ashkenazi Jewish ancestry. There is no known consanguinity.  GENETIC COUNSELING ASSESSMENT: Stacey Cain is a 72 y.o. female with a personal and family history which is somewhat suggestive of a Hereditary Cancer Predisposition Syndrome. We, therefore, discussed and recommended the following at today's visit.   DISCUSSION: We reviewed the characteristics, features and inheritance patterns of hereditary cancer syndromes. We also discussed genetic testing, including the appropriate family members to test, the  process of testing, insurance coverage and turn-around-time for results. We discussed the implications of a negative, positive and/or variant of uncertain significant result. We recommended Stacey Cain pursue genetic testing for the Common Hereditary Cancer gene panel + Myelodysplastic/Leukemia Panel. The Common Hereditary Cancer Panel offered by [facility] includes sequencing and/or deletion duplication testing of the following 47 genes: APC, ATM, AXIN2, BARD1, BMPR1A, BRCA1, BRCA2, BRIP1, CDH1, CDKN2A (p14ARF), CDKN2A (p16INK4a), CKD4, CHEK2, CTNNA1, DICER1, EPCAM (Deletion/duplication testing only), GREM1 (promoter region deletion/duplication testing only), KIT, MEN1, MLH1, MSH2, MSH3, MSH6, MUTYH, NBN, NF1, NHTL1, PALB2, PDGFRA, PMS2, POLD1, POLE, PTEN, RAD50, RAD51C, RAD51D, SDHB, SDHC,  SDHD, SMAD4, SMARCA4. STK11, TP53, TSC1, TSC2, and VHL.  The following genes were evaluated for sequence changes only: SDHA and HOXB13 c.251G>A variant only.  [facility] Myelodysplastic Syndrome/Leukemia Panel: ATM, BLM, CEBPA, EPCAM, GATA2, HRAS, MLH1, MSH2, MSH6, NBN, NF1, PMS2, RUNX1, TERC, TERT, TP53  We discussed that only 5-10% of cancers are associated with a Hereditary cancer predisposition syndrome.  One of the most common hereditary cancer syndromes that increases breast cancer risk is called Hereditary Breast and Ovarian Cancer (HBOC) syndrome.  This syndrome is caused by mutations in the BRCA1 and BRCA2 genes.  This syndrome increases an individual's lifetime risk to develop breast, ovarian, pancreatic, and other types of cancer.  There are also many other cancer predisposition syndromes caused by mutations in several other genes.  We discussed that if she is found to have a mutation in one of these genes, it may impact future medical management recommendations such as increased cancer screenings and consideration of risk reducing surgeries.  A positive result could also have implications for the patient's family  members.  A Negative result would mean we were unable to identify a hereditary component to her cancer, but does not rule out the possibility of a hereditary basis for her and her family's cancer.  There could be mutations that are undetectable by current technology, or in genes not yet tested or identified to increase cancer risk.    We discussed the potential to find a Variant of Uncertain Significance or VUS.  These are variants that have not yet been identified as pathogenic or benign, and it is unknown if this variant is associated with increased cancer risk or if this is a normal finding.  Most VUS's are reclassified to benign or likely benign.   It should not be used to make medical management decisions. With time, we suspect the lab will determine the significance of any VUS's identified if any.   Based on Stacey Cain's personal and family history of cancer, she meets medical criteria for genetic testing. Despite that she meets criteria, she may still have an out of pocket cost. We discussed that if her out of pocket cost for testing is over $100, the laboratory will call and confirm whether she wants to proceed with testing.  If the out of pocket cost of testing is less than $100 she will be billed by the genetic testing laboratory. We discussed that individuals with medicare typically have $0 OOP cost.   PLAN: After considering the risks, benefits, and limitations, Stacey Cain  provided informed consent to pursue genetic testing and the blood sample was sent to [facility] for analysis of the Common Hereditary Cancer Panel + Myelodysplastic/Leukemia. Results should be available within approximately 2-3 weeks' time, at which point they will be disclosed by telephone to Stacey Cain, as will any additional recommendations warranted by these results. Stacey Cain will receive a summary of her genetic counseling visit and a copy of her results once available. This information will also be available  in Epic. We encouraged Ms. Slivinski to remain in contact with cancer genetics annually so that we can continuously update the family history and inform her of any changes in cancer genetics and testing that may be of benefit for her family. Ms. Scearce questions were answered to her satisfaction today. Our contact information was provided should additional questions or concerns arise.  Based on Ms. Lipe's family history, we recommended her siblings and all other relatives, have genetic counseling and testing. Ms. Audi will let  us know if we can be of any assistance in coordinating genetic counseling and/or testing for this family member.   Lastly, we encouraged Ms. Upson to remain in contact with cancer genetics annually so that we can continuously update the family history and inform her of any changes in cancer genetics and testing that may be of benefit for this family.   Ms.  Opfer questions were answered to her satisfaction today. Our contact information was provided should additional questions or concerns arise. Thank you for the referral and allowing [place] to share in the care of your patient.   Tana Felts, MS Genetic Counselor [email or web address] phone: [phone]  The patient was seen for a total of 50 minutes in face-to-face genetic counseling.

## 2018-01-04 ENCOUNTER — Telehealth: Payer: Self-pay | Admitting: Genetics

## 2018-01-04 NOTE — Telephone Encounter (Signed)
Revealed negative genetic testing.  Revealed that a VUS in [facility] was identified.   This normal result indicates that we did not identify a hereditary cause for  Stacey Cain's cancer is due to a hereditary cause.  It is unlikely that there is an increased risk of another cancer due to a mutation in one of these genes.  However, genetic testing is not perfect, and cannot definitively rule out a hereditary cause.  It will be important for her to keep in contact with genetics to learn if any additional testing may be needed in the future.    We recommended her other paternal and maternal relatives have genetic testing as there could be a hereditary cause for the cancer in the family that Stacey Cain did not inherit.  We also recommended Stacey Cain and her family inform their physicians of the family history of cancer and to follow all cancer screening recommendations their doctors provide.

## 2018-01-21 ENCOUNTER — Ambulatory Visit: Payer: Self-pay | Admitting: Genetics

## 2018-01-21 DIAGNOSIS — Z800 Family history of malignant neoplasm of digestive organs: Secondary | ICD-10-CM

## 2018-01-21 DIAGNOSIS — Z1379 Encounter for other screening for genetic and chromosomal anomalies: Secondary | ICD-10-CM | POA: Insufficient documentation

## 2018-01-21 DIAGNOSIS — Z8042 Family history of malignant neoplasm of prostate: Secondary | ICD-10-CM

## 2018-01-21 DIAGNOSIS — Z8041 Family history of malignant neoplasm of ovary: Secondary | ICD-10-CM

## 2018-01-21 DIAGNOSIS — Z853 Personal history of malignant neoplasm of breast: Secondary | ICD-10-CM

## 2018-01-21 NOTE — Progress Notes (Signed)
HPI: Ms. Cobarrubias was previously seen in the [facility] clinic on 12/25/2017 due to a personal and family history of cancer and concerns regarding a hereditary predisposition to cancer. Please refer to our prior cancer genetics clinic note for more information regarding Ms. Allshouse's medical, social and family histories, and our assessment and recommendations, at the time. Ms. Klinedinst recent genetic test results were disclosed to her, as well as recommendations warranted by these results. These results and recommendations are discussed in more detail below.  CANCER HISTORY:   In 2004, at the age of 61, Ms. Blas was diagnosed with right sided breast cancer that was reportedly ER+.  She reports having lumpectomy followed by radiation.  She reports that no nodes were involved.  FAMILY HISTORY:  We obtained a detailed, 4-generation family history.  Significant diagnoses are listed below: Family History  Problem Relation Age of Onset  . Prostate cancer Father 32       prostate removed  . Skin cancer Father   . Ovarian cancer Mother 35  . Prostate cancer Brother 60       active surveillance- had a few biopsies   . Leukemia Maternal Uncle 87  . Pancreatic cancer Paternal Grandmother 75  . Colon cancer Neg Hx   . Esophageal cancer Neg Hx   . Rectal cancer Neg Hx   . Stomach cancer Neg Hx     Ms. Sunderlin has 2 sons ages 68 and 2 with no history of cancer.  Her oldest son has a son and a daughter. Her youngest son has 2 daughters and 2 sons. Her grandchildren are ages 27-23.    Ms. Sparlin has 2 brothers.  One was diagnosed with prostate cancer at 28 and is just being watched.  He is now 42.  This brother has 3 daughters. Her other brother is 88 with no history of cancer.  He has a daughter.    Ms. Lover father was diagnosed with prostate cancer at 13 and had his prostate removed.  He died at 75.  Ms. Irigoyen has 2 paternal uncles and 5 paternal aunts with no known history of  cancer, although one paternal aunt might have had leukemia, but she is not sure.  Ms. Latricia Heft has several paternal cousins, none with any known history of cancer.  She reports she is very close and keeps in good contact with her cousins. Ms. Leisner paternal grandmother died in her 34's due to heart diease and her paternal grandmother died in her 87's due to pancreatic cancer diagnosed in her 44's.  She reports heart disease and parkinson's runs on her father's side of the family.   Ms. Druck mother was diagnosed with ovarian cancer at 72 and died in her 40's. Ms. Nawaz has a maternal uncle who died of leukemia in his 19's.  She has another maternal uncle who lived to 21 with no history of cancer.  She also has a maternal aunt who lived to 43 with no history of cancer.  Her maternal grandparents lived into their 29's with no history of cancer.  Ms. Jagiello has several maternal cousins with no history of cancer (she is close/good contact with these reltaives).   Ms. Hearst is unaware of previous family history of genetic testing for hereditary cancer risks. Patient's maternal ancestors are of N. European descent, and paternal ancestors are of N. European descent. There is no reported Ashkenazi Jewish ancestry. There is no known consanguinity.  GENETIC TEST RESULTS: Genetic testing performed  through [facility]'s Common Hereditary Cancers Panel + Myelodysplastic/Leukemia panel reported out on 12/31/2017 showed no pathogenic mutations. The following genes were evaluated for sequence changes and exonic deletions/duplications: APC, ATM, AXIN2, BARD1, BLM, BMPR1A, BRCA1, BRCA2, BRIP1, CDH1, CDK4, CDKN2A (p14ARF), CDKN2A (p16INK4a), CEBPA, CHEK2, CTNNA1, DICER1, EPCAM*, GATA2, GREM1*, HRAS, KIT, MEN1, MLH1, MSH2, MSH3, MSH6, MUTYH, NBN, NF1, PALB2, PDGFRA, PMS2, POLD1, POLE, PTEN, RAD50, RAD51C, RAD51D, RUNX1, SDHB, SDHC, SDHD, SMAD4, SMARCA4, STK11, TERC, TERT, TP53, TSC1, TSC2, VHL. The following genes were  evaluated for sequence changes only: HOXB13*, NTHL1*, SDHA.  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     We discussed with Ms. Dalgleish that because current genetic testing is not perfect, it is possible there may be a gene mutation in one of these genes that current testing cannot detect, but that chance is small. We also discussed, that there could be another gene that has not yet been discovered, or that we have not yet tested, that is responsible for the cancer diagnoses in the family. It is also possible there is a hereditary cause for the cancer in the family that Ms. Eisenhart did not inherit and therefore was not identified in her testing.  Therefore, it is important to remain in touch with cancer genetics in the future so that we can continue to offer Ms. Debellis the most up to date genetic testing.   Regarding the VUS in SMARCA4: At this time, it is unknown if this variant is associated with increased cancer risk or if this is a normal finding, but most variants such as this get reclassified to being inconsequential. It should not be used to make medical management decisions. With time, we suspect the lab will determine the significance of this variant, if any. If we do learn more about it, we will try to contact Ms. Lawley to discuss it further. However, it is important to stay in touch with [place] periodically and keep the address and phone number up to date.  ADDITIONAL GENETIC TESTING: We discussed with Ms. Rotter that there are other genes that are associated with increased cancer risk that can be analyzed. The laboratories that offer this testing look at these additional genes via a hereditary cancer gene panel. Should Ms. Keel wish to pursue  additional genetic testing, we are happy to discuss and coordinate this testing, at any time.    CANCER SCREENING RECOMMENDATIONS:  This negative result means that we were unable to identify a hereditary cause for her personal and family history of cancer at this time.  This result, however, cannot rule out a hereditary basis for her cancer.  It is still possible that there could be genetic mutations that are undetectable by current technology, or genetic mutations in genes that have not been tested or identified to increase cancer risk.  It is also possible there is a hereditary cause for the cancer in her family that Ms. Arseneault did not inherit and therefore was not detected in her.   Families with features suggestive of hereditary risk for cancer tend to have multiple family members with cancer, diagnoses in multiple generations, and diagnoses before the age of 34. Ms. Teo family exhibits some of these features. Therefore this negative result migh be due to the limitations  of current technology and knowledge.  Given her personal and family history of cancer, we recommend Ms. Janik continue to follow the cancer screening and treatment recommendations provided to her by her physicians.     RECOMMENDATIONS FOR FAMILY MEMBERS: Individuals in this family might be at some increased risk of developing cancer, over the general population risk, simply due to the family history of cancer. We recommended women in this family have a yearly mammogram beginning at age 66, or 38 years younger than the earliest onset of cancer, an annual clinical breast exam, and perform monthly breast self-exams. Women in this family should also have a gynecological exam as recommended by their primary provider. All family members should have a colonoscopy by age 36.  All family members should inform their physicians about the family history of cancer so their doctors can make the most appropriate screening recommendations for  them.   Based on Ms. Kemler's family history, we recommended her siblings and all maternal and paternal relatives (especially those affected with cancer), have genetic counseling and testing. Ms. Bacot will let us know if we can be of any assistance in coordinating genetic counseling and/or testing for these family members.   FOLLOW-UP: Lastly, we discussed with Ms. Lenger that cancer genetics is a rapidly advancing field and it is possible that new genetic tests will be appropriate for her and/or her family members in the future. We encouraged her to remain in contact with cancer genetics on an annual basis so we can update her personal and family histories and let her know of advances in cancer genetics that may benefit this family.   Our contact number was provided. Ms. Kozloski questions were answered to her satisfaction, and she knows she is welcome to call us at anytime with additional questions or concerns.   Ferol Luz, MS, [facility] Certified Genetic Counselor [email or web address]

## 2018-01-22 ENCOUNTER — Encounter: Payer: Self-pay | Admitting: Genetics

## 2018-02-01 DIAGNOSIS — L821 Other seborrheic keratosis: Secondary | ICD-10-CM | POA: Diagnosis not present

## 2018-02-01 DIAGNOSIS — L218 Other seborrheic dermatitis: Secondary | ICD-10-CM | POA: Diagnosis not present

## 2018-02-01 DIAGNOSIS — L720 Epidermal cyst: Secondary | ICD-10-CM | POA: Diagnosis not present

## 2018-02-01 DIAGNOSIS — L812 Freckles: Secondary | ICD-10-CM | POA: Diagnosis not present

## 2018-02-01 DIAGNOSIS — D1801 Hemangioma of skin and subcutaneous tissue: Secondary | ICD-10-CM | POA: Diagnosis not present

## 2018-03-04 DIAGNOSIS — R05 Cough: Secondary | ICD-10-CM | POA: Diagnosis not present

## 2018-03-04 DIAGNOSIS — J301 Allergic rhinitis due to pollen: Secondary | ICD-10-CM | POA: Diagnosis not present

## 2018-03-04 DIAGNOSIS — B9789 Other viral agents as the cause of diseases classified elsewhere: Secondary | ICD-10-CM | POA: Diagnosis not present

## 2018-03-04 DIAGNOSIS — J019 Acute sinusitis, unspecified: Secondary | ICD-10-CM | POA: Diagnosis not present

## 2018-03-11 ENCOUNTER — Other Ambulatory Visit: Payer: Self-pay | Admitting: Family Medicine

## 2018-03-11 DIAGNOSIS — Z1231 Encounter for screening mammogram for malignant neoplasm of breast: Secondary | ICD-10-CM

## 2018-04-19 ENCOUNTER — Ambulatory Visit
Admission: RE | Admit: 2018-04-19 | Discharge: 2018-04-19 | Disposition: A | Discharge status: Home / Self Care | Payer: Medicare Other | Source: Ambulatory Visit | Attending: Family Medicine | Admitting: Family Medicine

## 2018-04-19 DIAGNOSIS — Z1231 Encounter for screening mammogram for malignant neoplasm of breast: Secondary | ICD-10-CM | POA: Diagnosis not present

## 2018-07-04 DIAGNOSIS — H9190 Unspecified hearing loss, unspecified ear: Secondary | ICD-10-CM | POA: Diagnosis not present

## 2018-08-09 DIAGNOSIS — H9313 Tinnitus, bilateral: Secondary | ICD-10-CM | POA: Diagnosis not present

## 2018-08-09 DIAGNOSIS — H903 Sensorineural hearing loss, bilateral: Secondary | ICD-10-CM | POA: Diagnosis not present

## 2018-08-23 DIAGNOSIS — H838X3 Other specified diseases of inner ear, bilateral: Secondary | ICD-10-CM | POA: Diagnosis not present

## 2018-08-23 DIAGNOSIS — H903 Sensorineural hearing loss, bilateral: Secondary | ICD-10-CM | POA: Diagnosis not present

## 2018-10-02 DIAGNOSIS — Z853 Personal history of malignant neoplasm of breast: Secondary | ICD-10-CM | POA: Diagnosis not present

## 2018-10-02 DIAGNOSIS — Z8041 Family history of malignant neoplasm of ovary: Secondary | ICD-10-CM | POA: Diagnosis not present

## 2018-10-02 DIAGNOSIS — Z124 Encounter for screening for malignant neoplasm of cervix: Secondary | ICD-10-CM | POA: Diagnosis not present

## 2018-11-11 DIAGNOSIS — H6693 Otitis media, unspecified, bilateral: Secondary | ICD-10-CM | POA: Diagnosis not present

## 2018-11-11 DIAGNOSIS — J209 Acute bronchitis, unspecified: Secondary | ICD-10-CM | POA: Diagnosis not present

## 2018-11-11 DIAGNOSIS — R05 Cough: Secondary | ICD-10-CM | POA: Diagnosis not present

## 2018-12-09 DIAGNOSIS — E7800 Pure hypercholesterolemia, unspecified: Secondary | ICD-10-CM | POA: Diagnosis not present

## 2018-12-09 DIAGNOSIS — D649 Anemia, unspecified: Secondary | ICD-10-CM | POA: Diagnosis not present

## 2018-12-09 DIAGNOSIS — Z131 Encounter for screening for diabetes mellitus: Secondary | ICD-10-CM | POA: Diagnosis not present

## 2018-12-09 DIAGNOSIS — M810 Age-related osteoporosis without current pathological fracture: Secondary | ICD-10-CM | POA: Diagnosis not present

## 2018-12-19 DIAGNOSIS — Z131 Encounter for screening for diabetes mellitus: Secondary | ICD-10-CM | POA: Diagnosis not present

## 2018-12-19 DIAGNOSIS — E7800 Pure hypercholesterolemia, unspecified: Secondary | ICD-10-CM | POA: Diagnosis not present

## 2018-12-19 DIAGNOSIS — Z0000 Encounter for general adult medical examination without abnormal findings: Secondary | ICD-10-CM | POA: Diagnosis not present

## 2018-12-19 DIAGNOSIS — R011 Cardiac murmur, unspecified: Secondary | ICD-10-CM | POA: Diagnosis not present

## 2018-12-19 DIAGNOSIS — M810 Age-related osteoporosis without current pathological fracture: Secondary | ICD-10-CM | POA: Diagnosis not present

## 2019-01-02 DIAGNOSIS — J101 Influenza due to other identified influenza virus with other respiratory manifestations: Secondary | ICD-10-CM | POA: Diagnosis not present

## 2019-02-03 DIAGNOSIS — L430 Hypertrophic lichen planus: Secondary | ICD-10-CM | POA: Diagnosis not present

## 2019-02-03 DIAGNOSIS — D1801 Hemangioma of skin and subcutaneous tissue: Secondary | ICD-10-CM | POA: Diagnosis not present

## 2019-02-03 DIAGNOSIS — L812 Freckles: Secondary | ICD-10-CM | POA: Diagnosis not present

## 2019-02-03 DIAGNOSIS — L718 Other rosacea: Secondary | ICD-10-CM | POA: Diagnosis not present

## 2019-02-03 DIAGNOSIS — L821 Other seborrheic keratosis: Secondary | ICD-10-CM | POA: Diagnosis not present

## 2019-02-03 DIAGNOSIS — L720 Epidermal cyst: Secondary | ICD-10-CM | POA: Diagnosis not present

## 2019-02-03 DIAGNOSIS — D485 Neoplasm of uncertain behavior of skin: Secondary | ICD-10-CM | POA: Diagnosis not present

## 2019-02-03 DIAGNOSIS — L820 Inflamed seborrheic keratosis: Secondary | ICD-10-CM | POA: Diagnosis not present

## 2019-02-10 DIAGNOSIS — H26493 Other secondary cataract, bilateral: Secondary | ICD-10-CM | POA: Diagnosis not present

## 2019-03-28 ENCOUNTER — Other Ambulatory Visit: Payer: Self-pay | Admitting: Family Medicine

## 2019-03-28 DIAGNOSIS — Z1231 Encounter for screening mammogram for malignant neoplasm of breast: Secondary | ICD-10-CM

## 2019-04-04 ENCOUNTER — Other Ambulatory Visit: Payer: Self-pay | Admitting: Family Medicine

## 2019-04-04 DIAGNOSIS — M810 Age-related osteoporosis without current pathological fracture: Secondary | ICD-10-CM

## 2019-05-22 ENCOUNTER — Other Ambulatory Visit: Payer: Self-pay

## 2019-05-22 ENCOUNTER — Ambulatory Visit
Admission: RE | Admit: 2019-05-22 | Discharge: 2019-05-22 | Disposition: A | Discharge status: Home / Self Care | Payer: Medicare Other | Source: Ambulatory Visit | Attending: Family Medicine | Admitting: Family Medicine

## 2019-05-22 DIAGNOSIS — Z1231 Encounter for screening mammogram for malignant neoplasm of breast: Secondary | ICD-10-CM

## 2019-06-27 ENCOUNTER — Other Ambulatory Visit: Payer: Medicare Other

## 2019-07-01 DIAGNOSIS — Z20828 Contact with and (suspected) exposure to other viral communicable diseases: Secondary | ICD-10-CM | POA: Diagnosis not present

## 2019-08-23 DIAGNOSIS — Z23 Encounter for immunization: Secondary | ICD-10-CM | POA: Diagnosis not present

## 2019-08-29 ENCOUNTER — Ambulatory Visit
Admission: RE | Admit: 2019-08-29 | Discharge: 2019-08-29 | Disposition: A | Discharge status: Home / Self Care | Payer: Medicare Other | Source: Ambulatory Visit | Attending: Family Medicine | Admitting: Family Medicine

## 2019-08-29 ENCOUNTER — Other Ambulatory Visit: Payer: Self-pay

## 2019-08-29 DIAGNOSIS — M8589 Other specified disorders of bone density and structure, multiple sites: Secondary | ICD-10-CM | POA: Diagnosis not present

## 2019-08-29 DIAGNOSIS — Z780 Asymptomatic menopausal state: Secondary | ICD-10-CM | POA: Diagnosis not present

## 2019-08-29 DIAGNOSIS — M810 Age-related osteoporosis without current pathological fracture: Secondary | ICD-10-CM

## 2019-10-07 DIAGNOSIS — Z8041 Family history of malignant neoplasm of ovary: Secondary | ICD-10-CM | POA: Diagnosis not present

## 2019-10-07 DIAGNOSIS — Z853 Personal history of malignant neoplasm of breast: Secondary | ICD-10-CM | POA: Diagnosis not present

## 2019-10-07 DIAGNOSIS — Z124 Encounter for screening for malignant neoplasm of cervix: Secondary | ICD-10-CM | POA: Diagnosis not present

## 2019-10-07 DIAGNOSIS — Z01419 Encounter for gynecological examination (general) (routine) without abnormal findings: Secondary | ICD-10-CM | POA: Diagnosis not present

## 2019-10-07 DIAGNOSIS — M8580 Other specified disorders of bone density and structure, unspecified site: Secondary | ICD-10-CM | POA: Diagnosis not present

## 2019-10-07 DIAGNOSIS — N8111 Cystocele, midline: Secondary | ICD-10-CM | POA: Diagnosis not present

## 2019-10-14 DIAGNOSIS — Z8041 Family history of malignant neoplasm of ovary: Secondary | ICD-10-CM | POA: Diagnosis not present

## 2019-11-10 DIAGNOSIS — J019 Acute sinusitis, unspecified: Secondary | ICD-10-CM | POA: Diagnosis not present

## 2019-11-10 DIAGNOSIS — Z20828 Contact with and (suspected) exposure to other viral communicable diseases: Secondary | ICD-10-CM | POA: Diagnosis not present

## 2019-12-18 DIAGNOSIS — E7800 Pure hypercholesterolemia, unspecified: Secondary | ICD-10-CM | POA: Diagnosis not present

## 2019-12-18 DIAGNOSIS — Z0000 Encounter for general adult medical examination without abnormal findings: Secondary | ICD-10-CM | POA: Diagnosis not present

## 2019-12-18 DIAGNOSIS — M810 Age-related osteoporosis without current pathological fracture: Secondary | ICD-10-CM | POA: Diagnosis not present

## 2019-12-18 DIAGNOSIS — Z131 Encounter for screening for diabetes mellitus: Secondary | ICD-10-CM | POA: Diagnosis not present

## 2019-12-18 DIAGNOSIS — R011 Cardiac murmur, unspecified: Secondary | ICD-10-CM | POA: Diagnosis not present

## 2019-12-24 DIAGNOSIS — J309 Allergic rhinitis, unspecified: Secondary | ICD-10-CM | POA: Diagnosis not present

## 2019-12-24 DIAGNOSIS — Z0000 Encounter for general adult medical examination without abnormal findings: Secondary | ICD-10-CM | POA: Diagnosis not present

## 2020-01-01 DIAGNOSIS — Z23 Encounter for immunization: Secondary | ICD-10-CM | POA: Diagnosis not present

## 2020-01-13 ENCOUNTER — Ambulatory Visit: Payer: Medicare Other | Admitting: Allergy & Immunology

## 2020-01-29 DIAGNOSIS — Z23 Encounter for immunization: Secondary | ICD-10-CM | POA: Diagnosis not present

## 2020-02-05 ENCOUNTER — Ambulatory Visit: Payer: Medicare Other | Admitting: Allergy & Immunology

## 2020-02-09 DIAGNOSIS — D1801 Hemangioma of skin and subcutaneous tissue: Secondary | ICD-10-CM | POA: Diagnosis not present

## 2020-02-09 DIAGNOSIS — L718 Other rosacea: Secondary | ICD-10-CM | POA: Diagnosis not present

## 2020-02-09 DIAGNOSIS — L821 Other seborrheic keratosis: Secondary | ICD-10-CM | POA: Diagnosis not present

## 2020-02-09 DIAGNOSIS — L812 Freckles: Secondary | ICD-10-CM | POA: Diagnosis not present

## 2020-02-09 DIAGNOSIS — L723 Sebaceous cyst: Secondary | ICD-10-CM | POA: Diagnosis not present

## 2020-02-09 DIAGNOSIS — D224 Melanocytic nevi of scalp and neck: Secondary | ICD-10-CM | POA: Diagnosis not present

## 2020-02-09 DIAGNOSIS — L820 Inflamed seborrheic keratosis: Secondary | ICD-10-CM | POA: Diagnosis not present

## 2020-03-05 ENCOUNTER — Other Ambulatory Visit (HOSPITAL_COMMUNITY): Payer: Self-pay | Admitting: Family Medicine

## 2020-03-05 ENCOUNTER — Other Ambulatory Visit: Payer: Self-pay | Admitting: Family Medicine

## 2020-03-05 DIAGNOSIS — R202 Paresthesia of skin: Secondary | ICD-10-CM

## 2020-03-05 DIAGNOSIS — E7800 Pure hypercholesterolemia, unspecified: Secondary | ICD-10-CM | POA: Diagnosis not present

## 2020-03-09 ENCOUNTER — Ambulatory Visit: Payer: Medicare Other | Admitting: Allergy & Immunology

## 2020-03-10 ENCOUNTER — Ambulatory Visit (HOSPITAL_COMMUNITY)
Admission: RE | Admit: 2020-03-10 | Discharge: 2020-03-10 | Disposition: A | Discharge status: Home / Self Care | Payer: Medicare Other | Source: Ambulatory Visit | Attending: Family Medicine | Admitting: Family Medicine

## 2020-03-10 ENCOUNTER — Other Ambulatory Visit: Payer: Self-pay

## 2020-03-10 DIAGNOSIS — R202 Paresthesia of skin: Secondary | ICD-10-CM | POA: Insufficient documentation

## 2020-03-10 DIAGNOSIS — R42 Dizziness and giddiness: Secondary | ICD-10-CM | POA: Diagnosis not present

## 2020-03-23 ENCOUNTER — Ambulatory Visit (INDEPENDENT_AMBULATORY_CARE_PROVIDER_SITE_OTHER): Payer: Medicare Other | Admitting: Allergy & Immunology

## 2020-03-23 ENCOUNTER — Other Ambulatory Visit: Payer: Self-pay

## 2020-03-23 ENCOUNTER — Encounter: Payer: Self-pay | Admitting: Allergy & Immunology

## 2020-03-23 VITALS — BP 104/60 | HR 77 | Temp 98.4°F | Resp 18 | Ht 65.0 in | Wt 151.4 lb

## 2020-03-23 DIAGNOSIS — R011 Cardiac murmur, unspecified: Secondary | ICD-10-CM | POA: Diagnosis not present

## 2020-03-23 DIAGNOSIS — J3089 Other allergic rhinitis: Secondary | ICD-10-CM | POA: Diagnosis not present

## 2020-03-23 DIAGNOSIS — R202 Paresthesia of skin: Secondary | ICD-10-CM | POA: Diagnosis not present

## 2020-03-23 DIAGNOSIS — E7800 Pure hypercholesterolemia, unspecified: Secondary | ICD-10-CM | POA: Diagnosis not present

## 2020-03-23 DIAGNOSIS — J302 Other seasonal allergic rhinitis: Secondary | ICD-10-CM | POA: Diagnosis not present

## 2020-03-23 MED ORDER — FLUTICASONE PROPIONATE 50 MCG/ACT NA SUSP: 1.0000 | Freq: Every day | NASAL | 5 refills | Status: DC

## 2020-03-23 MED ORDER — IPRATROPIUM BROMIDE 0.06 % NA SOLN: 2.0000 | Freq: Three times a day (TID) | NASAL | 5 refills | Status: DC
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
## 2020-03-23 NOTE — Patient Instructions (Addendum)
1. Seasonal and perennial allergic rhinitis - Testing today showed: grasses, ragweed, weeds, indoor molds, outdoor molds and dog - Copy of test results provided.  - Avoidance measures provided. - Continue with: Allegra (fexofenadine) 180mg  tablet once daily (you can continue to alternate over the counter antihistamines) and Flonase (fluticasone) one spray per nostril daily - Start taking: nasal Atrovent (ipratropium) one spray per nostril up to three times daily for control of runny nose/postnasal drip (this can be OVER drying, so be careful) - You can use an extra dose of the antihistamine, if needed, for breakthrough symptoms.  - Consider nasal saline rinses 1-2 times daily to remove allergens from the nasal cavities as well as help with mucous clearance (this is especially helpful to do before the nasal sprays are given) - Consider allergy shots as a means of long-term control. - Allergy shots "re-train" and "reset" the immune system to ignore environmental allergens and decrease the resulting immune response to those allergens (sneezing, itchy watery eyes, runny nose, nasal congestion, etc).    - Allergy shots improve symptoms in 75-85% of patients.  - We can discuss more at the next appointment if the medications are not working for you.  2. Return in about 3 months (around 06/22/2020). This can be an in-person, a virtual Webex or a telephone follow up visit.   Please inform us of any Emergency Department visits, hospitalizations, or changes in symptoms. Call us before going to the ED for breathing or allergy symptoms since we might be able to fit you in for a sick visit. Feel free to contact us anytime with any questions, problems, or concerns.  It was a pleasure to meet you today!  Websites that have reliable patient information: 1. American Academy of Asthma, Allergy, and Immunology: www.aaaai.org 2. Food Allergy Research and Education (FARE): foodallergy.org 3. Mothers of Asthmatics:  http://www.asthmacommunitynetwork.org 4. American College of Allergy, Asthma, and Immunology: www.acaai.org   COVID-19 Vaccine Information can be found at: ShippingScam.co.uk For questions related to vaccine distribution or appointments, please email vaccine@ [email or web address] or call [phone].     "Like" [place] on Facebook and Instagram for our latest updates!       HAPPY SPRING!  Make sure you are registered to vote! If you have moved or changed any of your contact information, you will need to get this updated before voting!  In some cases, you MAY be able to register to vote online: [email or web address]    Reducing Pollen Exposure  The American Academy of Allergy, Asthma and Immunology suggests the following steps to reduce your exposure to pollen during allergy seasons.    1. Do not hang sheets or clothing out to dry; pollen may collect on these items. 2. Do not mow lawns or spend time around freshly cut grass; mowing stirs up pollen. 3. Keep windows closed at night.  Keep car windows closed while driving. 4. Minimize morning activities outdoors, a time when pollen counts are usually at their highest. 5. Stay indoors as much as possible when pollen counts or humidity is high and on windy days when pollen tends to remain in the air longer. 6. Use air conditioning when possible.  Many air conditioners have filters that trap the pollen spores. 7. Use a HEPA room air filter to remove pollen form the indoor air you breathe.  Control of Mold Allergen   Mold and fungi can grow on a variety of surfaces provided certain temperature and moisture conditions exist.  Outdoor molds grow on plants, decaying vegetation  and soil.  The major outdoor mold, Alternaria and Cladosporium, are found in very high numbers during hot and dry conditions.  Generally, a late Summer - Fall peak is seen for common outdoor  fungal spores.  Rain will temporarily lower outdoor mold spore count, but counts rise rapidly when the rainy period ends.  The most important indoor molds are Aspergillus and Penicillium.  Dark, humid and poorly ventilated basements are ideal sites for mold growth.  The next most common sites of mold growth are the bathroom and the kitchen.  Outdoor (Seasonal) Mold Control  Positive outdoor molds via skin testing: Alternaria, Cladosporium, Bipolaris (Helminthsporium), Drechslera (Curvalaria) and Mucor  1. Use air conditioning and keep windows closed 2. Avoid exposure to decaying vegetation. 3. Avoid leaf raking. 4. Avoid grain handling. 5. Consider wearing a face mask if working in moldy areas.  6.   Indoor (Perennial) Mold Control   Positive indoor molds via skin testing: Aspergillus, Penicillium, Fusarium, Aureobasidium (Pullulara) and Rhizopus  1. Maintain humidity below 50%. 2. Clean washable surfaces with 5% bleach solution. 3. Remove sources e.g. contaminated carpets.     Control of Dog or Cat Allergen  Avoidance is the best way to manage a dog or cat allergy. If you have a dog or cat and are allergic to dog or cats, consider removing the dog or cat from the home. If you have a dog or cat but don't want to find it a new home, or if your family wants a pet even though someone in the household is allergic, here are some strategies that may help keep symptoms at bay:  1. Keep the pet out of your bedroom and restrict it to only a few rooms. Be advised that keeping the dog or cat in only one room will not limit the allergens to that room. 2. Don't pet, hug or kiss the dog or cat; if you do, wash your hands with soap and water. 3. High-efficiency particulate air (HEPA) cleaners run continuously in a bedroom or living room can reduce allergen levels over time. 4. Regular use of a high-efficiency vacuum cleaner or a central vacuum can reduce allergen levels. 5. Giving your dog or cat a  bath at least once a week can reduce airborne allergen.   Allergy Shots   Allergies are the result of a chain reaction that starts in the immune system. Your immune system controls how your body defends itself. For instance, if you have an allergy to pollen, your immune system identifies pollen as an invader or allergen. Your immune system overreacts by producing antibodies called Immunoglobulin E (IgE). These antibodies travel to cells that release chemicals, causing an allergic reaction.  The concept behind allergy immunotherapy, whether it is received in the form of shots or tablets, is that the immune system can be desensitized to specific allergens that trigger allergy symptoms. Although it requires time and patience, the payback can be long-term relief.  How Do Allergy Shots Work?  Allergy shots work much like a vaccine. Your body responds to injected amounts of a particular allergen given in increasing doses, eventually developing a resistance and tolerance to it. Allergy shots can lead to decreased, minimal or no allergy symptoms.  There generally are two phases: build-up and maintenance. Build-up often ranges from three to six months and involves receiving injections with increasing amounts of the allergens. The shots are typically given once or twice a week, though more rapid build-up schedules are sometimes used.  The maintenance phase  begins when the most effective dose is reached. This dose is different for each person, depending on how allergic you are and your response to the build-up injections. Once the maintenance dose is reached, there are longer periods between injections, typically two to four weeks.  Occasionally doctors give cortisone-type shots that can temporarily reduce allergy symptoms. These types of shots are different and should not be confused with allergy immunotherapy shots.  Who Can Be Treated with Allergy Shots?  Allergy shots may be a good treatment approach  for people with allergic rhinitis (hay fever), allergic asthma, conjunctivitis (eye allergy) or stinging insect allergy.   Before deciding to begin allergy shots, you should consider:  . The length of allergy season and the severity of your symptoms . Whether medications and/or changes to your environment can control your symptoms . Your desire to avoid long-term medication use . Time: allergy immunotherapy requires a major time commitment . Cost: may vary depending on your insurance coverage  Allergy shots for children age 56 and older are effective and often well tolerated. They might prevent the onset of new allergen sensitivities or the progression to asthma.  Allergy shots are not started on patients who are pregnant but can be continued on patients who become pregnant while receiving them. In some patients with other medical conditions or who take certain common medications, allergy shots may be of risk. It is important to mention other medications you talk to your allergist.   When Will I Feel Better?  Some may experience decreased allergy symptoms during the build-up phase. For others, it may take as long as 12 months on the maintenance dose. If there is no improvement after a year of maintenance, your allergist will discuss other treatment options with you.  If you aren't responding to allergy shots, it may be because there is not enough dose of the allergen in your vaccine or there are missing allergens that were not identified during your allergy testing. Other reasons could be that there are high levels of the allergen in your environment or major exposure to non-allergic triggers like tobacco smoke.  What Is the Length of Treatment?  Once the maintenance dose is reached, allergy shots are generally continued for three to five years. The decision to stop should be discussed with your allergist at that time. Some people may experience a permanent reduction of allergy symptoms.  Others may relapse and a longer course of allergy shots can be considered.  What Are the Possible Reactions?  The two types of adverse reactions that can occur with allergy shots are local and systemic. Common local reactions include very mild redness and swelling at the injection site, which can happen immediately or several hours after. A systemic reaction, which is less common, affects the entire body or a particular body system. They are usually mild and typically respond quickly to medications. Signs include increased allergy symptoms such as sneezing, a stuffy nose or hives.  Rarely, a serious systemic reaction called anaphylaxis can develop. Symptoms include swelling in the throat, wheezing, a feeling of tightness in the chest, nausea or dizziness. Most serious systemic reactions develop within 30 minutes of allergy shots. This is why it is strongly recommended you wait in your doctor's office for 30 minutes after your injections. Your allergist is trained to watch for reactions, and his or her staff is trained and equipped with the proper medications to identify and treat them.  Who Should Administer Allergy Shots?  The preferred location for  receiving shots is your prescribing allergist's office. Injections can sometimes be given at another facility where the physician and staff are trained to recognize and treat reactions, and have received instructions by your prescribing allergist.

## 2020-03-31 DIAGNOSIS — R300 Dysuria: Secondary | ICD-10-CM | POA: Diagnosis not present

## 2020-03-31 DIAGNOSIS — R350 Frequency of micturition: Secondary | ICD-10-CM | POA: Diagnosis not present

## 2020-04-08 DIAGNOSIS — E7800 Pure hypercholesterolemia, unspecified: Secondary | ICD-10-CM | POA: Diagnosis not present

## 2020-04-08 DIAGNOSIS — Z79899 Other long term (current) drug therapy: Secondary | ICD-10-CM | POA: Diagnosis not present

## 2020-04-12 ENCOUNTER — Other Ambulatory Visit: Payer: Self-pay | Admitting: Family Medicine

## 2020-04-12 DIAGNOSIS — Z1231 Encounter for screening mammogram for malignant neoplasm of breast: Secondary | ICD-10-CM

## 2020-04-29 ENCOUNTER — Encounter: Payer: Self-pay | Admitting: Cardiology

## 2020-04-29 ENCOUNTER — Other Ambulatory Visit: Payer: Self-pay

## 2020-04-29 ENCOUNTER — Ambulatory Visit (INDEPENDENT_AMBULATORY_CARE_PROVIDER_SITE_OTHER): Payer: Medicare Other | Admitting: Cardiology

## 2020-04-29 VITALS — BP 144/86 | HR 70 | Ht 65.0 in | Wt 155.2 lb

## 2020-04-29 DIAGNOSIS — R011 Cardiac murmur, unspecified: Secondary | ICD-10-CM | POA: Diagnosis not present

## 2020-04-29 DIAGNOSIS — E7800 Pure hypercholesterolemia, unspecified: Secondary | ICD-10-CM

## 2020-04-29 DIAGNOSIS — R42 Dizziness and giddiness: Secondary | ICD-10-CM | POA: Diagnosis not present

## 2020-04-29 NOTE — Patient Instructions (Signed)
Medication Instructions:  Your physician recommends that you continue on your current medications as directed. Please refer to the Current Medication list given to you today.  *If you need a refill on your cardiac medications before your next appointment, please call your pharmacy*  Testing/Procedures: Your physician has requested that you have a carotid duplex. This test is an ultrasound of the carotid arteries in your neck. It looks at blood flow through these arteries that supply the brain with blood. Allow one hour for this exam. There are no restrictions or special instructions.  Your physician has requested that you have an echocardiogram. Echocardiography is a painless test that uses sound waves to create images of your heart. It provides your doctor with information about the size and shape of your heart and how well your heart's chambers and valves are working. This procedure takes approximately one hour. There are no restrictions for this procedure.  Follow-Up: At [facility], you and your health needs are our priority.  As part of our continuing mission to provide you with exceptional heart care, we have created designated Provider Care Teams.  These Care Teams include your primary Cardiologist (physician) and Advanced Practice Providers (APPs -  Physician Assistants and Nurse Practitioners) who all work together to provide you with the care you need, when you need it.  Follow up with Dr. Radford Pax as needed based on results of testing.

## 2020-04-29 NOTE — Progress Notes (Signed)
Cardiology Office Note    Date:  04/29/2020   ID:  Stacey Cain, DOB 28-Jun-1946, MRN [id number]  PCP:  Stacey Cruel, MD  Cardiologist:  Fransico Him, MD   Chief Complaint  Patient presents with  . New Patient (Initial Visit)    heart murmur    History of Present Illness:  Stacey Cain is a 74 y.o. female who is being seen today for the evaluation of heart murmur at the request of Stacey Cain, *.  This is a 74yo female with a hx of Barrett esophagus, GERD, HLD who recently saw her PCP and was noted to have a heart murmur on exam.  She is now referred for further evaluation.  She is here today for followup and is doing well.  She denies any exertional chest pain or pressure, SOB, DOE, PND, orthopnea, LE edema, palpitations or syncope. She says that a week ago she awakened with pressure on her chest and thought it was GERD and took [place] which resolved it. About 2 months ago she had an episode of left arm numbness and lightheadedness that lasted 30 seconds at a time and had it 3 times in 1 day and then resolved.  Head MRI was negative and she was started on statin and ASA.  She is compliant with her meds and is tolerating meds with no SE.  She used to smoke but quit in 1975.  Her father had an MI at 10yo.   Past Medical History:  Diagnosis Date  . Allergy   . Barrett esophagus 2007   2010 and 2014  NO BARRETT'S  . Breast cancer (Stacey Cain) 12/2002  . Family history of ovarian cancer   . Family history of pancreatic cancer   . Family history of prostate cancer   . Gastritis   . GERD (gastroesophageal reflux disease)   . Hyperlipidemia   . Iron deficiency anemia   . Malignant neoplasm of breast (female) (Stacey Cain) 12/25/2017  . Osteopenia   . Personal history of breast cancer 12/25/2017  . S/P radiation therapy     Past Surgical History:  Procedure Laterality Date  . ABDOMINAL HYSTERECTOMY  1984   unilateral oophorectomy  . BREAST BIOPSY     benign stereo biopsy  left 2007  . BREAST LUMPECTOMY     right breast2004  . CESAREAN SECTION    . COLONOSCOPY    . COLONOSCOPY    . ESOPHAGOGASTRODUODENOSCOPY  multiple  . OVARIAN CYST REMOVAL     x2  . right breast milk duct     benign  . right breat lumpectomy  2004  . SHOULDER SURGERY Left 2016  . UPPER GASTROINTESTINAL ENDOSCOPY      Current Medications: Current Meds  Medication Sig  . ASPIRIN PO Take 81 mg by mouth daily.   . calcium-vitamin D (OSCAL WITH D) 500-200 MG-UNIT TABS tablet Take by mouth.  . Cholecalciferol 125 MCG (5000 UT) TABS   . fexofenadine (ALLEGRA) 180 MG tablet Take 180 mg by mouth daily.  . fluticasone (FLONASE) 50 MCG/ACT nasal spray Place 1-2 sprays into both nostrils daily.  Marland Kitchen ipratropium (ATROVENT) 0.06 % nasal spray Place 2 sprays into both nostrils 3 (three) times daily.  . rosuvastatin (CRESTOR) 20 MG tablet   . triamcinolone (NASACORT) 55 MCG/ACT AERO nasal inhaler Place into the nose.    Allergies:   Patient has no known allergies.   Social History   Socioeconomic History  . Marital status: Single  Spouse name: Not on file  . Number of children: 2  . Years of education: Not on file  . Highest education level: Not on file  Occupational History  . Occupation: Herbalist  Tobacco Use  . Smoking status: Former Smoker    Packs/day: 2.00    Years: 10.00    Pack years: 20.00    Types: Cigarettes    Quit date: 1975    Years since quitting: 46.4  . Smokeless tobacco: Never Used  Substance and Sexual Activity  . Alcohol use: No  . Drug use: No  . Sexual activity: Not on file  Other Topics Concern  . Not on file  Social History Narrative   Married, Herbalist   2 sons   Social Determinants of Health   Financial Resource Strain:   . Difficulty of Paying Living Expenses:   Food Insecurity:   . Worried About Charity fundraiser in the Last Year:   . Arboriculturist in the Last Year:   Transportation Needs:   . Film/video editor  (Medical):   Marland Kitchen Lack of Transportation (Non-Medical):   Physical Activity:   . Days of Exercise per Week:   . Minutes of Exercise per Session:   Stress:   . Feeling of Stress :   Social Connections:   . Frequency of Communication with Friends and Family:   . Frequency of Social Gatherings with Friends and Family:   . Attends Religious Services:   . Active Member of Clubs or Organizations:   . Attends Archivist Meetings:   Marland Kitchen Marital Status:      Family History:  The patient's family history includes Leukemia (age of onset: 42) in her maternal uncle; Ovarian cancer (age of onset: 2) in her mother; Pancreatic cancer (age of onset: 44) in her paternal grandmother; Prostate cancer (age of onset: 58) in her brother; Prostate cancer (age of onset: 48) in her father; Skin cancer in her father.   ROS:   Please see the history of present illness.    ROS All other systems reviewed and are negative.  No flowsheet data found.     PHYSICAL EXAM:   VS:  BP (!) 144/86   Pulse 70   Ht 5\' 5"  (1.651 m)   Wt 155 lb 4 oz (70.4 kg)   SpO2 99%   BMI 25.83 kg/m    GEN: Well nourished, well developed, in no acute distress  HEENT: normal  Neck: no JVD, carotid bruits, or masses Cardiac: RRR; no  rubs, or gallops,no edema.  Intact distal pulses bilaterally. 2/6 SM at RUSB Respiratory:  clear to auscultation bilaterally, normal work of breathing GI: soft, nontender, nondistended, + BS MS: no deformity or atrophy  Skin: warm and dry, no rash Neuro:  Alert and Oriented x 3, Strength and sensation are intact Psych: euthymic mood, full affect  Wt Readings from Last 3 Encounters:  04/29/20 155 lb 4 oz (70.4 kg)  03/23/20 151 lb 6.4 oz (68.7 kg)  09/08/13 155 lb (70.3 kg)      Studies/Labs Reviewed:   EKG:  EKG is ordered today.  The ekg ordered today demonstrates NSR with no ST changes and first degree AVB  Recent Labs: No results found for requested labs within last 8760 hours.     Lipid Panel No results found for: CHOL, TRIG, HDL, CHOLHDL, VLDL, LDLCALC, LDLDIRECT  Additional studies/ records that were reviewed today include:  OV notes from PCP  ASSESSMENT:    1. Heart murmur   2. Pure hypercholesterolemia   3. Dizziness      PLAN:  In order of problems listed above:  1.  Heart Murmur -I will get a 2D echo to assess further -suspect she has AVSC or AS  2.  HLD -followed by PCP -LDL was 58 after starting statin therapy -continue Crestor 20mg  daily  3.  Dizziness -she had a recent episode of dizziness associated with left arm numbness -no anginal sx -head MRI with no CVA>>?TIA -will get carotid dopplers to assess further    Medication Adjustments/Labs and Tests Ordered: Current medicines are reviewed at length with the patient today.  Concerns regarding medicines are outlined above.  Medication changes, Labs and Tests ordered today are listed in the Patient Instructions below.  There are no Patient Instructions on file for this visit.   Signed, Fransico Him, MD  04/29/2020 1:39 PM    [facility] [place], [place], [place]  [address] Phone: [phone]; Fax: [phone]

## 2020-04-29 NOTE — Addendum Note (Signed)
Addended by: Antonieta Iba on: 04/29/2020 01:43 PM   Modules accepted: Orders

## 2020-05-04 ENCOUNTER — Other Ambulatory Visit: Payer: Self-pay

## 2020-05-04 ENCOUNTER — Encounter: Payer: Self-pay | Admitting: Cardiology

## 2020-05-04 ENCOUNTER — Ambulatory Visit (HOSPITAL_BASED_OUTPATIENT_CLINIC_OR_DEPARTMENT_OTHER): Payer: Medicare Other

## 2020-05-04 ENCOUNTER — Ambulatory Visit (HOSPITAL_COMMUNITY)
Admission: RE | Admit: 2020-05-04 | Discharge: 2020-05-04 | Disposition: A | Discharge status: Home / Self Care | Payer: Medicare Other | Source: Ambulatory Visit | Attending: Cardiovascular Disease | Admitting: Cardiovascular Disease

## 2020-05-04 DIAGNOSIS — R42 Dizziness and giddiness: Secondary | ICD-10-CM | POA: Diagnosis not present

## 2020-05-04 DIAGNOSIS — R011 Cardiac murmur, unspecified: Secondary | ICD-10-CM

## 2020-05-04 DIAGNOSIS — I779 Disorder of arteries and arterioles, unspecified: Secondary | ICD-10-CM | POA: Insufficient documentation

## 2020-05-24 ENCOUNTER — Other Ambulatory Visit: Payer: Self-pay

## 2020-05-24 ENCOUNTER — Ambulatory Visit
Admission: RE | Admit: 2020-05-24 | Discharge: 2020-05-24 | Disposition: A | Discharge status: Home / Self Care | Payer: Medicare Other | Source: Ambulatory Visit | Attending: Family Medicine | Admitting: Family Medicine

## 2020-05-24 DIAGNOSIS — Z1231 Encounter for screening mammogram for malignant neoplasm of breast: Secondary | ICD-10-CM

## 2020-06-15 DIAGNOSIS — H26493 Other secondary cataract, bilateral: Secondary | ICD-10-CM | POA: Diagnosis not present

## 2020-06-29 ENCOUNTER — Encounter: Payer: Self-pay | Admitting: Allergy & Immunology

## 2020-06-29 ENCOUNTER — Ambulatory Visit (INDEPENDENT_AMBULATORY_CARE_PROVIDER_SITE_OTHER): Payer: Medicare Other | Admitting: Allergy & Immunology

## 2020-06-29 ENCOUNTER — Other Ambulatory Visit: Payer: Self-pay

## 2020-06-29 VITALS — BP 138/82 | HR 66 | Resp 18 | Ht 66.0 in

## 2020-06-29 DIAGNOSIS — J3089 Other allergic rhinitis: Secondary | ICD-10-CM

## 2020-06-29 DIAGNOSIS — J302 Other seasonal allergic rhinitis: Secondary | ICD-10-CM

## 2020-06-29 NOTE — Progress Notes (Signed)
FOLLOW UP  Date of Service/Encounter:  06/29/20   Assessment:   Seasonal and perennial allergic rhinitis (grasses, ragweed, weeds, indoor molds, outdoor molds and dog)  Systolic ejection murmur - with largely normal echocardiogram  Plan/Recommendations:   1. Seasonal and perennial allergic rhinitis (grasses, ragweed, weeds, indoor molds, outdoor molds and dog) - Continue with: Allegra (fexofenadine) 180mg  tablet once daily (you can continue to alternate over the counter antihistamines) and Flonase (fluticasone) one spray per nostril daily and nasal Atrovent (ipratropium) one spray per nostril up to three times daily for control of runny nose/postnasal drip (this can be OVER drying, so be careful) - You can try using Mucinex to see if this can help to thin out of the mucous.  - Add on Mucinex first to see if that helps. - If that does not help, add on the nasal Atrovent (ipratropium).   2. Return in about 6 months (around 12/30/2020). This can be an in-person, a virtual Webex or a telephone follow up visit.   Subjective:   Stacey Cain is a 74 y.o. female presenting today for follow up of  Chief Complaint  Patient presents with  . Allergic Rhinitis     Stacey Cain has a history of the following: Patient Active Problem List   Diagnosis Date Noted  . Carotid artery disease ([place])   . Genetic testing 01/21/2018  . Personal history of breast cancer 12/25/2017  . Family history of ovarian cancer   . Family history of pancreatic cancer   . Family history of prostate cancer   . GERD (gastroesophageal reflux disease) 01/22/2013    History obtained from: chart review and patient.  Stacey Cain is a 74 y.o. female presenting for a follow up visit.  She was last seen in our office in April 2021.  At that time, she had testing that was positive to grasses, ragweed, weeds, indoor and outdoor molds, and dog.  We continue with Allegra and Flonase and started nasal Atrovent.  We  also discussed allergen immunotherapy.  She had a systolic ejection murmur which apparently was new, so we referred to cardiology for an evaluation.  Since last visit, she has mostly done well. She did have the echocardiogram which was normal. But then she started having dizziness. A brain MRI was normal and a carotid ultrasound was normal. She is now on a baby aspirin and a cholesterol medication.  Allergic Rhinitis Symptom History: She is taking the fluticasone and the cetirizine every day. She has used the Atrovent rarely. She was on Allegra and she has switched intermittently over the years. She is going to plan to switch between antihistamines.   Otherwise, there have been no changes to her past medical history, surgical history, family history, or social history.    Review of Systems  Constitutional: Negative.  Negative for chills, fever, malaise/fatigue and weight loss.  HENT: Positive for congestion and sinus pain. Negative for ear discharge and ear pain.        Positive for postnasal drip.  Eyes: Negative for pain, discharge and redness.  Respiratory: Negative for cough, sputum production, shortness of breath and wheezing.   Cardiovascular: Negative.  Negative for chest pain and palpitations.  Gastrointestinal: Negative for abdominal pain, constipation, diarrhea, heartburn, nausea and vomiting.  Skin: Negative.  Negative for itching and rash.  Neurological: Negative for dizziness and headaches.  Endo/Heme/Allergies: Positive for environmental allergies. Does not bruise/bleed easily.       Objective:   Blood pressure 138/82,  pulse 66, resp. rate 18, height 5\' 6"  (1.676 m), SpO2 97 %. Body mass index is 25.06 kg/m.   Physical Exam:  Physical Exam Constitutional:      Appearance: She is well-developed.     Comments: Very pleasant and talkative.  HENT:     Head: Normocephalic and atraumatic.     Right Ear: Tympanic membrane, ear canal and external ear normal. No drainage,  swelling or tenderness. Tympanic membrane is not injected, scarred, erythematous, retracted or bulging.     Left Ear: Tympanic membrane, ear canal and external ear normal. No drainage, swelling or tenderness. Tympanic membrane is not injected, scarred, erythematous, retracted or bulging.     Nose: No nasal deformity, septal deviation, mucosal edema or rhinorrhea.     Right Turbinates: Enlarged.     Left Turbinates: Enlarged.     Right Sinus: No maxillary sinus tenderness or frontal sinus tenderness.     Left Sinus: No maxillary sinus tenderness or frontal sinus tenderness.     Comments: Turbinates are slightly enlarged and erythematous.    Mouth/Throat:     Mouth: Mucous membranes are not pale and not dry.     Pharynx: Uvula midline.  Eyes:     General:        Right eye: No discharge.        Left eye: No discharge.     Conjunctiva/sclera: Conjunctivae normal.     Right eye: Right conjunctiva is not injected. No chemosis.    Left eye: Left conjunctiva is not injected. No chemosis.    Pupils: Pupils are equal, round, and reactive to light.  Cardiovascular:     Rate and Rhythm: Normal rate and regular rhythm.     Heart sounds: Normal heart sounds.  Pulmonary:     Effort: Pulmonary effort is normal. No tachypnea, accessory muscle usage or respiratory distress.     Breath sounds: Normal breath sounds. No wheezing, rhonchi or rales.     Comments: Moving air well in all lung fields.  No increased work of breathing. Chest:     Chest wall: No tenderness.  Abdominal:     Tenderness: There is no abdominal tenderness. There is no guarding or rebound.  Lymphadenopathy:     Head:     Right side of head: No submandibular, tonsillar or occipital adenopathy.     Left side of head: No submandibular, tonsillar or occipital adenopathy.     Cervical: No cervical adenopathy.  Skin:    Coloration: Skin is not pale.     Findings: No abrasion, erythema, petechiae or rash. Rash is not papular, urticarial  or vesicular.  Neurological:     Mental Status: She is alert.      Diagnostic studies: none       Salvatore Marvel, MD  [facility] [place]

## 2020-06-29 NOTE — Patient Instructions (Addendum)
1. Seasonal and perennial allergic rhinitis (grasses, ragweed, weeds, indoor molds, outdoor molds and dog) - Continue with: Allegra (fexofenadine) 180mg  tablet once daily (you can continue to alternate over the counter antihistamines) and Flonase (fluticasone) one spray per nostril daily and nasal Atrovent (ipratropium) one spray per nostril up to three times daily for control of runny nose/postnasal drip (this can be OVER drying, so be careful) - You can try using Mucinex to see if this can help to thin out of the mucous.  - Add on Mucinex first to see if that helps. - If that does not help, add on the nasal Atrovent (ipratropium).   2. Return in about 6 months (around 12/30/2020). This can be an in-person, a virtual Webex or a telephone follow up visit.   Please inform us of any Emergency Department visits, hospitalizations, or changes in symptoms. Call us before going to the ED for breathing or allergy symptoms since we might be able to fit you in for a sick visit. Feel free to contact us anytime with any questions, problems, or concerns.  It was a pleasure to see you again today!  Websites that have reliable patient information: 1. American Academy of Asthma, Allergy, and Immunology: www.aaaai.org 2. Food Allergy Research and Education (FARE): foodallergy.org 3. Mothers of Asthmatics: http://www.asthmacommunitynetwork.org 4. American College of Allergy, Asthma, and Immunology: www.acaai.org   COVID-19 Vaccine Information can be found at: ShippingScam.co.uk For questions related to vaccine distribution or appointments, please email [email or web address] or call [phone].     "Like" [place] on Facebook and Instagram for our latest updates!        Make sure you are registered to vote! If you have moved or changed any of your contact information, you will need to get this updated before voting!  In some cases, you MAY be able to  register to vote online: [email or web address]

## 2020-09-16 DIAGNOSIS — Z23 Encounter for immunization: Secondary | ICD-10-CM | POA: Diagnosis not present

## 2020-09-29 DIAGNOSIS — Z23 Encounter for immunization: Secondary | ICD-10-CM | POA: Diagnosis not present

## 2020-12-19 ENCOUNTER — Other Ambulatory Visit: Payer: Self-pay | Admitting: Allergy & Immunology

## 2020-12-21 DIAGNOSIS — M810 Age-related osteoporosis without current pathological fracture: Secondary | ICD-10-CM | POA: Diagnosis not present

## 2020-12-21 DIAGNOSIS — E7800 Pure hypercholesterolemia, unspecified: Secondary | ICD-10-CM | POA: Diagnosis not present

## 2020-12-21 DIAGNOSIS — Z131 Encounter for screening for diabetes mellitus: Secondary | ICD-10-CM | POA: Diagnosis not present

## 2020-12-21 DIAGNOSIS — Z79899 Other long term (current) drug therapy: Secondary | ICD-10-CM | POA: Diagnosis not present

## 2020-12-27 DIAGNOSIS — M810 Age-related osteoporosis without current pathological fracture: Secondary | ICD-10-CM | POA: Diagnosis not present

## 2020-12-27 DIAGNOSIS — Z0000 Encounter for general adult medical examination without abnormal findings: Secondary | ICD-10-CM | POA: Diagnosis not present

## 2020-12-27 DIAGNOSIS — M1990 Unspecified osteoarthritis, unspecified site: Secondary | ICD-10-CM | POA: Diagnosis not present

## 2020-12-27 DIAGNOSIS — E7800 Pure hypercholesterolemia, unspecified: Secondary | ICD-10-CM | POA: Diagnosis not present

## 2020-12-27 DIAGNOSIS — R944 Abnormal results of kidney function studies: Secondary | ICD-10-CM | POA: Diagnosis not present

## 2020-12-27 DIAGNOSIS — Z79899 Other long term (current) drug therapy: Secondary | ICD-10-CM | POA: Diagnosis not present

## 2020-12-30 ENCOUNTER — Encounter: Payer: Self-pay | Admitting: Allergy & Immunology

## 2020-12-30 ENCOUNTER — Other Ambulatory Visit: Payer: Self-pay

## 2020-12-30 ENCOUNTER — Ambulatory Visit (INDEPENDENT_AMBULATORY_CARE_PROVIDER_SITE_OTHER): Payer: Medicare Other | Admitting: Allergy & Immunology

## 2020-12-30 VITALS — BP 126/84 | HR 73 | Temp 98.5°F | Resp 18 | Ht 65.5 in | Wt 157.8 lb

## 2020-12-30 DIAGNOSIS — J302 Other seasonal allergic rhinitis: Secondary | ICD-10-CM

## 2020-12-30 DIAGNOSIS — J3089 Other allergic rhinitis: Secondary | ICD-10-CM | POA: Diagnosis not present

## 2020-12-30 MED ORDER — IPRATROPIUM BROMIDE 0.06 % NA SOLN: 2.0000 | Freq: Every day | NASAL | 11 refills | Status: AC

## 2020-12-30 MED ORDER — FLUTICASONE PROPIONATE 50 MCG/ACT NA SUSP: 1.0000 | Freq: Every day | NASAL | 11 refills | Status: DC

## 2020-12-30 NOTE — Progress Notes (Signed)
FOLLOW UP  Date of Service/Encounter:  12/30/20   Assessment:   Seasonal and perennial allergic rhinitis(grasses, ragweed, weeds, indoor molds, outdoor molds and dog)  Systolic ejection murmur - with largely normal echocardiogram  Plan/Recommendations:   1. Seasonal and perennial allergic rhinitis (grasses, ragweed, weeds, indoor molds, outdoor molds and dog) - Continue with: Allegra (fexofenadine) 180mg  tablet once daily (you can continue to alternate over the counter antihistamines) and Flonase (fluticasone) one spray per nostril daily and nasal Atrovent (ipratropium) one spray per nostril up to three times daily for control of runny nose/postnasal drip (this can be OVER drying, so be careful) - You can try using Mucinex to see if this can help to thin out of the mucous (samples provided). - Add on Mucinex first to see if that helps. - If that does not help, add on the nasal Atrovent (ipratropium).  - Consider allergy shots for long term control of symptoms.    2. Return in about 1 year (around 12/30/2021).   Subjective:   Stacey Cain is a 75 y.o. female presenting today for follow up of  Chief Complaint  Patient presents with  . Allergic Rhinitis     Has gotten worse more sinus drainage mostly clear sometimes yellow  Sometimes has blood in tissue when she blows her nose in the morning    Stacey Cain has a history of the following: Patient Active Problem List   Diagnosis Date Noted  . Carotid artery disease ([place])   . Genetic testing 01/21/2018  . Personal history of breast cancer 12/25/2017  . Family history of ovarian cancer   . Family history of pancreatic cancer   . Family history of prostate cancer   . GERD (gastroesophageal reflux disease) 01/22/2013    History obtained from: chart review and patient.  Stacey Cain is a 75 y.o. female presenting for a follow up visit.  She was last seen in August 2021.  At that time, we continue with Allegra as well as Flonase.   We also had a nasal Atrovent.  We recommended Mucinex to see if this can help with thinning out her mucus.  Since last visit, she has not really done any better. She remains on the Flonase once daily and the Zyrtec once daily. Sometimes she will take an extra Zyrtec. She never really started Mucinex. She reports that this is more drainage and postnasal drip. She has not been using it much   Her last testing was done in April 2021.  At that time, she was positive to grasses, ragweed, weeds, indoor and outdoor molds, and dog. She has never really been interested in allergy shots, as she lives in [place] and the traveling would be rather exhausting for her.   Otherwise, there have been no changes to her past medical history, surgical history, family history, or social history.    Review of Systems  Constitutional: Negative.  Negative for chills, fever, malaise/fatigue and weight loss.  HENT: Positive for congestion and sinus pain. Negative for ear discharge and ear pain.   Eyes: Negative for pain, discharge and redness.  Respiratory: Negative for cough, sputum production, shortness of breath and wheezing.   Cardiovascular: Negative.  Negative for chest pain and palpitations.  Gastrointestinal: Negative for abdominal pain, constipation, diarrhea, heartburn, nausea and vomiting.  Skin: Negative.  Negative for itching and rash.  Neurological: Negative for dizziness and headaches.  Endo/Heme/Allergies: Positive for environmental allergies. Does not bruise/bleed easily.  Objective:   Blood pressure 126/84, pulse 73, temperature 98.5 F (36.9 C), resp. rate 18, height 5' 5.5" (1.664 m), weight 157 lb 12.8 oz (71.6 kg), SpO2 98 %. Body mass index is 25.86 kg/m.   Physical Exam:  Physical Exam Constitutional:      Appearance: She is well-developed.  HENT:     Head: Normocephalic and atraumatic.     Right Ear: Tympanic membrane, ear canal and external ear normal.     Left Ear:  Tympanic membrane and ear canal normal.     Nose: No nasal deformity, septal deviation, mucosal edema, rhinorrhea or epistaxis.     Right Sinus: No maxillary sinus tenderness or frontal sinus tenderness.     Left Sinus: No maxillary sinus tenderness or frontal sinus tenderness.     Mouth/Throat:     Mouth: Oropharynx is clear and moist. Mucous membranes are not pale and not dry.     Pharynx: Uvula midline.  Eyes:     General:        Right eye: No discharge.        Left eye: No discharge.     Extraocular Movements: EOM normal.     Conjunctiva/sclera: Conjunctivae normal.     Right eye: Right conjunctiva is not injected. No chemosis.    Left eye: Left conjunctiva is not injected. No chemosis.    Pupils: Pupils are equal, round, and reactive to light.  Cardiovascular:     Rate and Rhythm: Normal rate and regular rhythm.     Heart sounds: Normal heart sounds.  Pulmonary:     Effort: Pulmonary effort is normal. No tachypnea, accessory muscle usage or respiratory distress.     Breath sounds: Normal breath sounds. No wheezing, rhonchi or rales.  Chest:     Chest wall: No tenderness.  Lymphadenopathy:     Cervical: No cervical adenopathy.  Skin:    Coloration: Skin is not pale.     Findings: No abrasion, erythema, petechiae or rash. Rash is not papular, urticarial or vesicular.  Neurological:     Mental Status: She is alert.  Psychiatric:        Mood and Affect: Mood and affect normal.      Diagnostic studies: none     Salvatore Marvel, MD  [facility] [place]

## 2020-12-30 NOTE — Patient Instructions (Addendum)
1. Seasonal and perennial allergic rhinitis (grasses, ragweed, weeds, indoor molds, outdoor molds and dog) - Continue with: Allegra (fexofenadine) 180mg  tablet once daily (you can continue to alternate over the counter antihistamines) and Flonase (fluticasone) one spray per nostril daily and nasal Atrovent (ipratropium) one spray per nostril up to three times daily for control of runny nose/postnasal drip (this can be OVER drying, so be careful) - You can try using Mucinex to see if this can help to thin out of the mucous (samples provided). - Add on Mucinex first to see if that helps. - If that does not help, add on the nasal Atrovent (ipratropium).  - Consider allergy shots for long term control of symptoms.    2. Return in about 1 year (around 12/30/2021).    Please inform us of any Emergency Department visits, hospitalizations, or changes in symptoms. Call us before going to the ED for breathing or allergy symptoms since we might be able to fit you in for a sick visit. Feel free to contact us anytime with any questions, problems, or concerns.  It was a pleasure to see you again today!  Websites that have reliable patient information: 1. American Academy of Asthma, Allergy, and Immunology: www.aaaai.org 2. Food Allergy Research and Education (FARE): foodallergy.org 3. Mothers of Asthmatics: http://www.asthmacommunitynetwork.org 4. American College of Allergy, Asthma, and Immunology: www.acaai.org   COVID-19 Vaccine Information can be found at: ShippingScam.co.uk For questions related to vaccine distribution or appointments, please email [email or web address] or call [phone].     "Like" [place] on Facebook and Instagram for our latest updates!       Make sure you are registered to vote! If you have moved or changed any of your contact information, you will need to get this updated before voting!  In some cases, you MAY be  able to register to vote online: [email or web address]

## 2021-01-12 DIAGNOSIS — S6391XA Sprain of unspecified part of right wrist and hand, initial encounter: Secondary | ICD-10-CM | POA: Diagnosis not present

## 2021-02-09 DIAGNOSIS — L720 Epidermal cyst: Secondary | ICD-10-CM | POA: Diagnosis not present

## 2021-02-09 DIAGNOSIS — L821 Other seborrheic keratosis: Secondary | ICD-10-CM | POA: Diagnosis not present

## 2021-02-09 DIAGNOSIS — M7130 Other bursal cyst, unspecified site: Secondary | ICD-10-CM | POA: Diagnosis not present

## 2021-02-09 DIAGNOSIS — D1801 Hemangioma of skin and subcutaneous tissue: Secondary | ICD-10-CM | POA: Diagnosis not present

## 2021-02-09 DIAGNOSIS — L812 Freckles: Secondary | ICD-10-CM | POA: Diagnosis not present

## 2021-02-09 DIAGNOSIS — L570 Actinic keratosis: Secondary | ICD-10-CM | POA: Diagnosis not present

## 2021-02-27 IMAGING — MG DIGITAL SCREENING BILAT W/ TOMO W/ CAD
6 of 12 series · 6 of 36 positions shown · non-contrast
Comparison: Previous exam(s).

CLINICAL DATA: Screening.

EXAM:
DIGITAL SCREENING BILATERAL MAMMOGRAM WITH TOMO AND CAD

[L MLO synth-2D (1 of 2)]
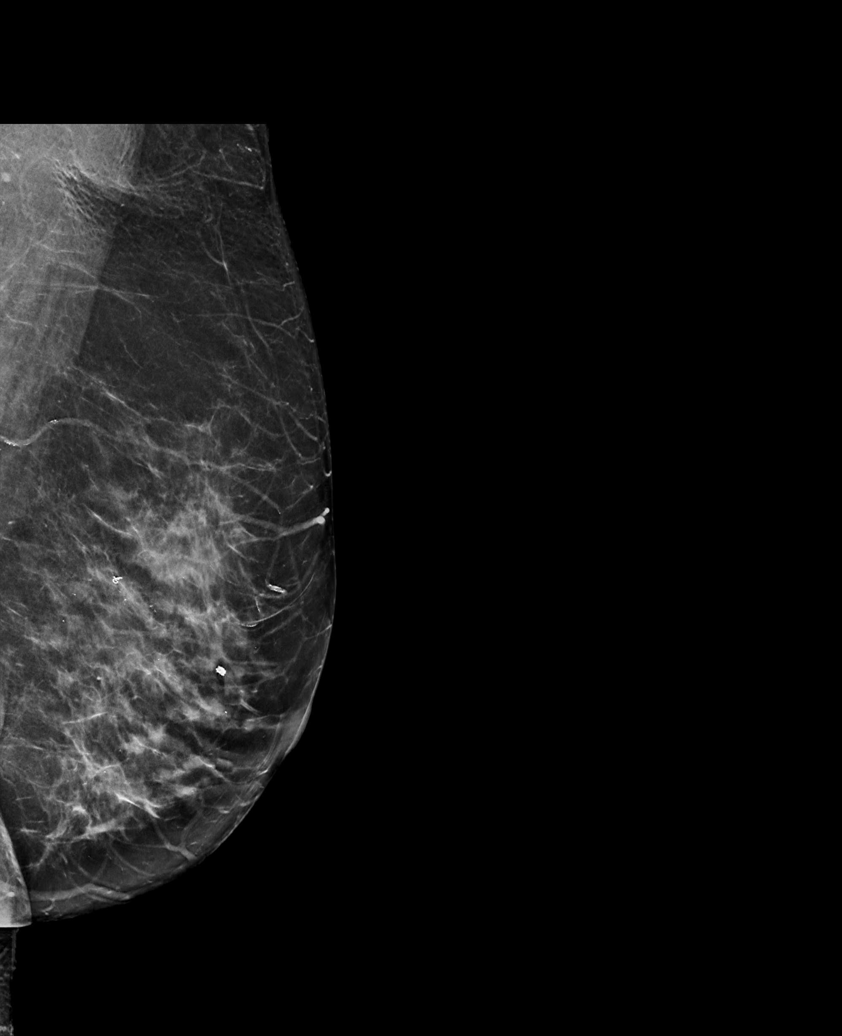

[R CC synth-2D (1 of 2)]
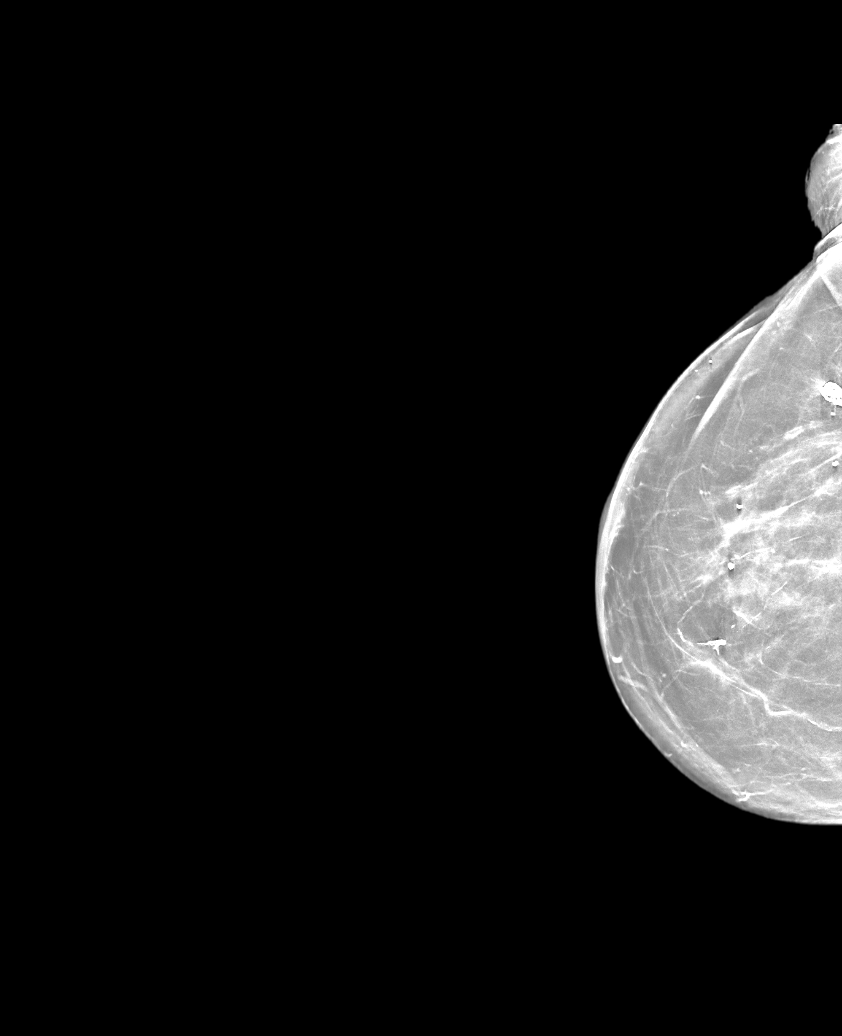

[L CC synth-2D]
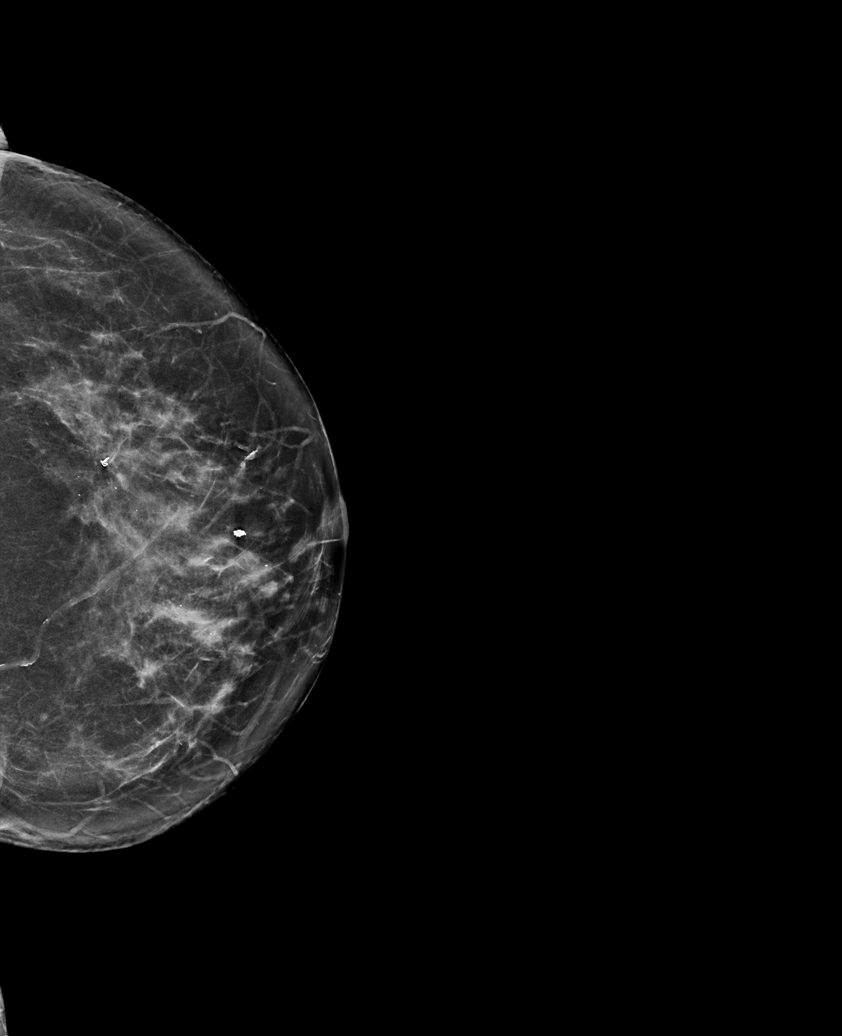

[R CC synth-2D (2 of 2)]
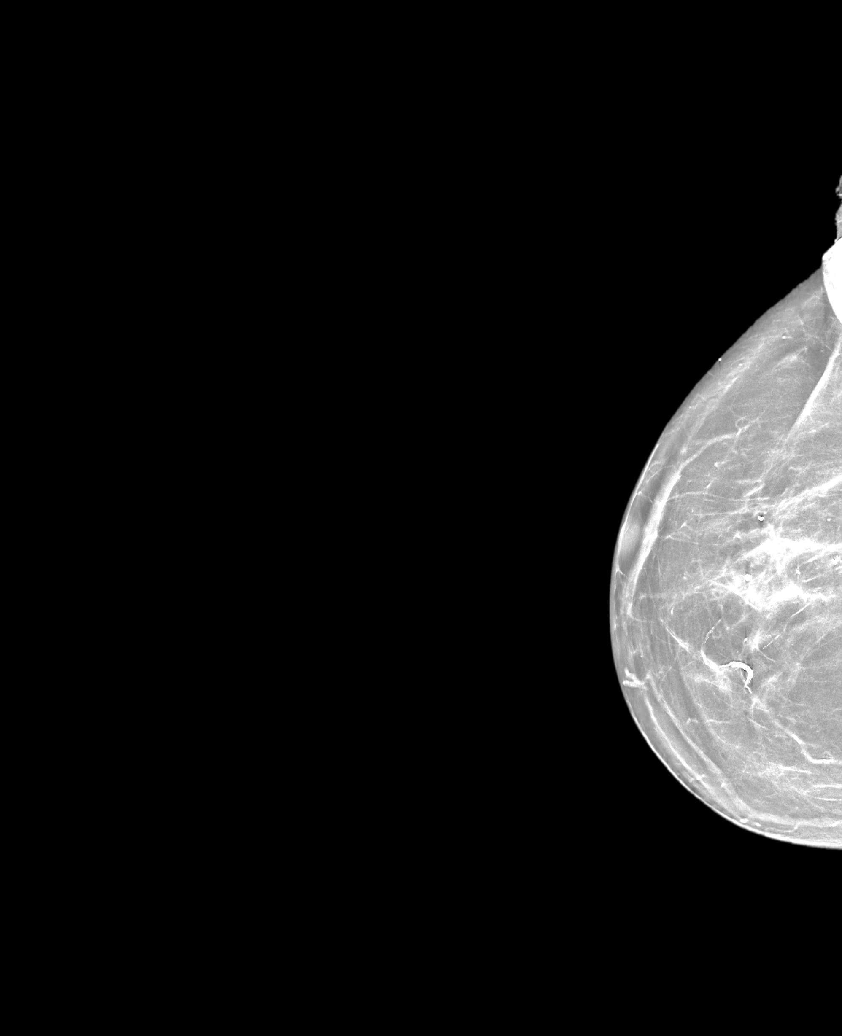

[R MLO synth-2D]
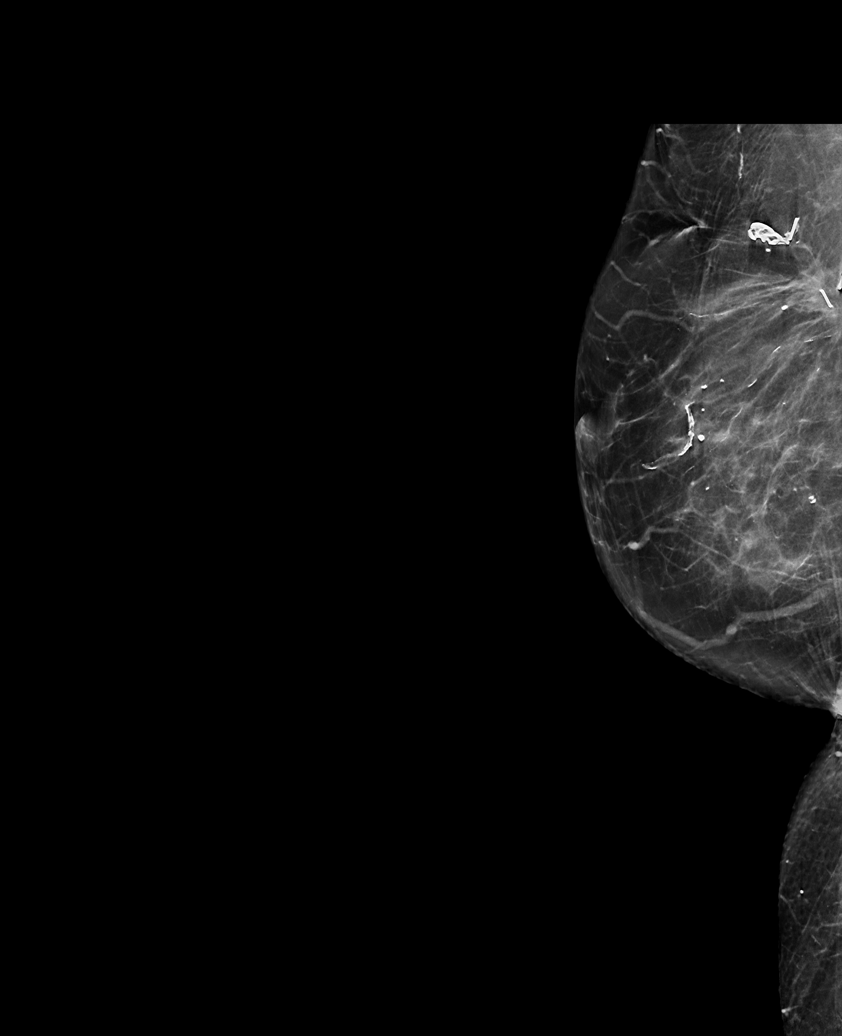

[L MLO synth-2D (2 of 2)]
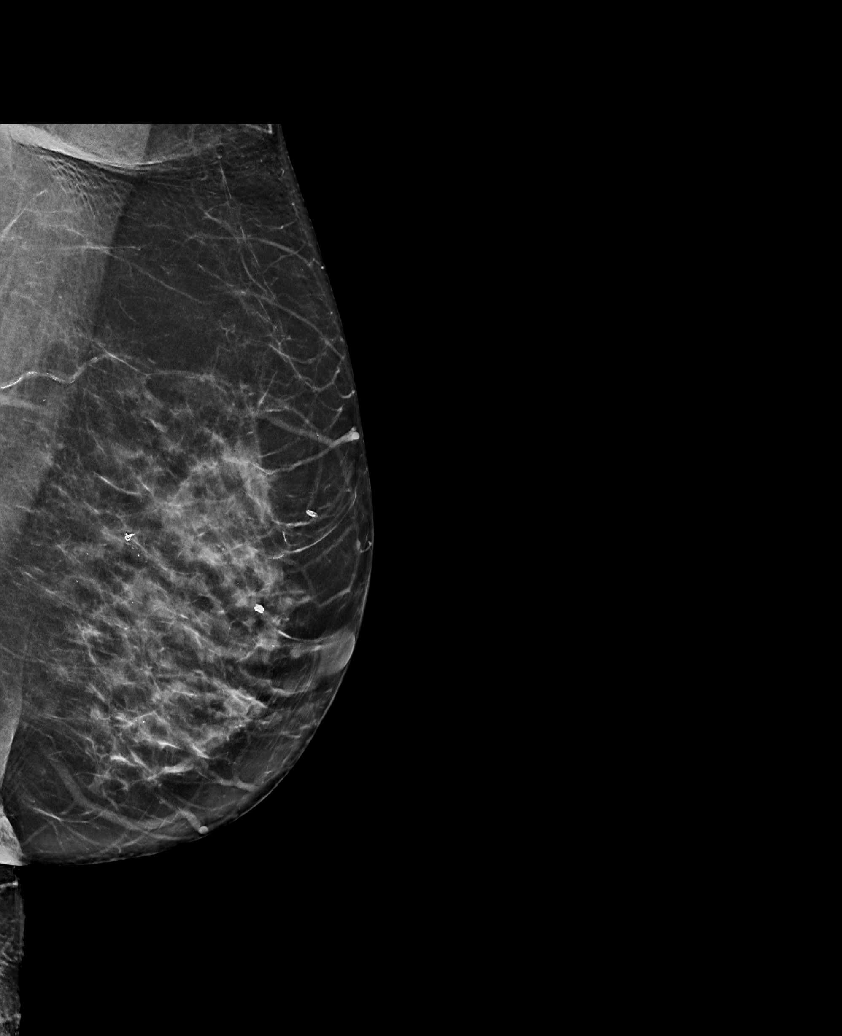

[6 of 36 positions shown; findings below may reference images not displayed]

ACR Breast Density Category c: The breast tissue is heterogeneously
dense, which may obscure small masses.
FINDINGS: There are no findings suspicious for malignancy. Images were
processed with CAD.
IMPRESSION: No mammographic evidence of malignancy. A result letter of this
screening mammogram will be mailed directly to the patient.

RECOMMENDATION:
Screening mammogram in one year. (Code:[id number])

BI-RADS CATEGORY  1: Negative.

## 2021-04-12 ENCOUNTER — Other Ambulatory Visit: Payer: Self-pay | Admitting: Family Medicine

## 2021-04-12 DIAGNOSIS — Z1231 Encounter for screening mammogram for malignant neoplasm of breast: Secondary | ICD-10-CM

## 2021-06-02 NOTE — Progress Notes (Signed)
SMACA4 [id number]>C (Intronic) VUS has been reclassified to Likely Benign.  The updated report date is May 31, 2021.

## 2021-06-07 ENCOUNTER — Encounter: Payer: Self-pay | Admitting: Genetic Counselor

## 2021-06-08 ENCOUNTER — Other Ambulatory Visit: Payer: Self-pay

## 2021-06-08 ENCOUNTER — Ambulatory Visit
Admission: RE | Admit: 2021-06-08 | Discharge: 2021-06-08 | Disposition: A | Discharge status: Home / Self Care | Payer: Medicare Other | Source: Ambulatory Visit | Attending: Family Medicine | Admitting: Family Medicine

## 2021-06-08 DIAGNOSIS — Z1231 Encounter for screening mammogram for malignant neoplasm of breast: Secondary | ICD-10-CM

## 2021-07-19 DIAGNOSIS — R051 Acute cough: Secondary | ICD-10-CM | POA: Diagnosis not present

## 2021-07-19 DIAGNOSIS — U071 COVID-19: Secondary | ICD-10-CM | POA: Diagnosis not present

## 2021-09-14 DIAGNOSIS — Z23 Encounter for immunization: Secondary | ICD-10-CM | POA: Diagnosis not present

## 2021-09-21 DIAGNOSIS — Z01419 Encounter for gynecological examination (general) (routine) without abnormal findings: Secondary | ICD-10-CM | POA: Diagnosis not present

## 2021-09-21 DIAGNOSIS — Z6825 Body mass index (BMI) 25.0-25.9, adult: Secondary | ICD-10-CM | POA: Diagnosis not present

## 2021-09-21 DIAGNOSIS — Z8041 Family history of malignant neoplasm of ovary: Secondary | ICD-10-CM | POA: Diagnosis not present

## 2021-09-21 DIAGNOSIS — M8580 Other specified disorders of bone density and structure, unspecified site: Secondary | ICD-10-CM | POA: Diagnosis not present

## 2021-09-21 DIAGNOSIS — Z124 Encounter for screening for malignant neoplasm of cervix: Secondary | ICD-10-CM | POA: Diagnosis not present

## 2021-09-21 DIAGNOSIS — Z01411 Encounter for gynecological examination (general) (routine) with abnormal findings: Secondary | ICD-10-CM | POA: Diagnosis not present

## 2021-09-28 ENCOUNTER — Other Ambulatory Visit: Payer: Self-pay | Admitting: Obstetrics and Gynecology

## 2021-09-28 DIAGNOSIS — M8580 Other specified disorders of bone density and structure, unspecified site: Secondary | ICD-10-CM

## 2021-12-28 DIAGNOSIS — M810 Age-related osteoporosis without current pathological fracture: Secondary | ICD-10-CM | POA: Diagnosis not present

## 2021-12-28 DIAGNOSIS — E7800 Pure hypercholesterolemia, unspecified: Secondary | ICD-10-CM | POA: Diagnosis not present

## 2021-12-28 DIAGNOSIS — Z79899 Other long term (current) drug therapy: Secondary | ICD-10-CM | POA: Diagnosis not present

## 2022-01-02 DIAGNOSIS — Z79899 Other long term (current) drug therapy: Secondary | ICD-10-CM | POA: Diagnosis not present

## 2022-01-02 DIAGNOSIS — E7800 Pure hypercholesterolemia, unspecified: Secondary | ICD-10-CM | POA: Diagnosis not present

## 2022-01-02 DIAGNOSIS — M810 Age-related osteoporosis without current pathological fracture: Secondary | ICD-10-CM | POA: Diagnosis not present

## 2022-01-02 DIAGNOSIS — Z0000 Encounter for general adult medical examination without abnormal findings: Secondary | ICD-10-CM | POA: Diagnosis not present

## 2022-01-03 ENCOUNTER — Other Ambulatory Visit: Payer: Self-pay

## 2022-01-03 ENCOUNTER — Encounter: Payer: Self-pay | Admitting: Allergy & Immunology

## 2022-01-03 ENCOUNTER — Ambulatory Visit (INDEPENDENT_AMBULATORY_CARE_PROVIDER_SITE_OTHER): Payer: Medicare Other | Admitting: Allergy & Immunology

## 2022-01-03 VITALS — BP 116/72 | HR 69 | Temp 98.2°F | Resp 16 | Ht 65.0 in | Wt 159.2 lb

## 2022-01-03 DIAGNOSIS — J302 Other seasonal allergic rhinitis: Secondary | ICD-10-CM

## 2022-01-03 DIAGNOSIS — J3089 Other allergic rhinitis: Secondary | ICD-10-CM | POA: Diagnosis not present

## 2022-01-03 MED ORDER — CARBINOXAMINE MALEATE 4 MG PO TABS: 4.0000 mg | ORAL_TABLET | Freq: Three times a day (TID) | ORAL | 5 refills | Status: AC | PRN

## 2022-01-03 NOTE — Patient Instructions (Addendum)
1. Seasonal and perennial allergic rhinitis (grasses, ragweed, weeds, indoor molds, outdoor molds and dog) - Continue with: Zyrtec (cetirizine) once daily (you can continue to alternate over the counter antihistamines) and Flonase (fluticasone) one spray per nostril daily  - Start taking: carbinoxamine 4mg  every 8 hours as needed (can cause sleepiness, but it can help dry through throat out) - Alternate your antihistamines every 1-2 months with Allegra or Xyzal (Claritin does not seem to work very well).  - Consider allergy shots for long term control of symptoms.    2. Return in about 1 year (around 01/03/2023).    Please inform us of any Emergency Department visits, hospitalizations, or changes in symptoms. Call us before going to the ED for breathing or allergy symptoms since we might be able to fit you in for a sick visit. Feel free to contact us anytime with any questions, problems, or concerns.  It was a pleasure to see you again today!  Websites that have reliable patient information: 1. American Academy of Asthma, Allergy, and Immunology: www.aaaai.org 2. Food Allergy Research and Education (FARE): foodallergy.org 3. Mothers of Asthmatics: http://www.asthmacommunitynetwork.org 4. American College of Allergy, Asthma, and Immunology: www.acaai.org   COVID-19 Vaccine Information can be found at: ShippingScam.co.uk For questions related to vaccine distribution or appointments, please email [email or web address] or call [phone].     Like [place] on [place] and Instagram for our latest updates!       Make sure you are registered to vote! If you have moved or changed any of your contact information, you will need to get this updated before voting!  In some cases, you MAY be able to register to vote online: [email or web address]

## 2022-01-03 NOTE — Progress Notes (Signed)
FOLLOW UP  Date of Service/Encounter:  01/03/22   Assessment:   Seasonal and perennial allergic rhinitis (grasses, ragweed, weeds, indoor molds, outdoor molds and dog)   Systolic ejection murmur - with largely normal echocardiogram (now resolved completely)  Plan/Recommendations:   1. Seasonal and perennial allergic rhinitis (grasses, ragweed, weeds, indoor molds, outdoor molds and dog) - Continue with: Zyrtec (cetirizine) once daily (you can continue to alternate over the counter antihistamines) and Flonase (fluticasone) one spray per nostril daily  - Start taking: carbinoxamine 4mg  every 8 hours as needed (can cause sleepiness, but it can help dry through throat out) - Alternate your antihistamines every 1-2 months with Allegra or Xyzal (Claritin does not seem to work very well).  - Consider allergy shots for long term control of symptoms.    2. Return in about 1 year (around 01/03/2023).   Subjective:   Stacey Cain is a 76 y.o. female presenting today for follow up of  Chief Complaint  Patient presents with   Follow-up    Stacey Cain has a history of the following: Patient Active Problem List   Diagnosis Date Noted   Carotid artery disease ([place])    Genetic testing 01/21/2018   Personal history of breast cancer 12/25/2017   Family history of ovarian cancer    Family history of pancreatic cancer    Family history of prostate cancer    GERD (gastroesophageal reflux disease) 01/22/2013    History obtained from: chart review and patient.  Stacey Cain is a 76 y.o. female presenting for a follow up visit.  She was last seen in February 2022.  At that time, she was doing well on Allegra as well as Flonase and Atrovent. We discussed allergy shots in the past.   Since the last visit, she has largely done well. She is doing the Zyrtec once daily and then the Flonase one spray per nostril daily. She take the Mucinex fairly consistently. She does think that this helps her a lot. She  is getting fluticasone as a prescription and she thinks that her PCP does this.   She does have some funk in her throat intermittently off and on.  She has not tried any carbinoxamine.  That does not sound familiar for her at all.  She was on the Atrovent for a period of time, but it dried her out too much and she stopped using it.  She does think that the Mucinex has helped a lot with her symptoms.  Her PCP is here in [place] as well.  She does not have anywhere close to where she could get the shots if she decided to start allergy shots at all.  She did get a little stir crazy and decided to work part-time during the winter months.  She worked for a famous Secretary/administrator.  She started working in September and finished that after the holidays in December.  She said that even though I do not like fruit cake, this company will make it so that I do.  Otherwise, there have been no changes to her past medical history, surgical history, family history, or social history.    Review of Systems  Constitutional: Negative.  Negative for chills, fever, malaise/fatigue and weight loss.  HENT:  Positive for congestion and sinus pain. Negative for ear discharge and ear pain.   Eyes:  Negative for pain, discharge and redness.  Respiratory:  Negative for cough, sputum production, shortness of breath and wheezing.   Cardiovascular:  Negative.  Negative for chest pain and palpitations.  Gastrointestinal:  Negative for abdominal pain, constipation, diarrhea, heartburn, nausea and vomiting.  Skin: Negative.  Negative for itching and rash.  Neurological:  Negative for dizziness and headaches.  Endo/Heme/Allergies:  Positive for environmental allergies. Does not bruise/bleed easily.      Objective:   Blood pressure 116/72, pulse 69, temperature 98.2 F (36.8 C), resp. rate 16, height 5\' 5"  (1.651 m), weight 159 lb 4 oz (72.2 kg), SpO2 99 %. Body mass index is 26.5 kg/m.    Physical Exam Vitals  reviewed.  Constitutional:      Appearance: She is well-developed.  HENT:     Head: Normocephalic and atraumatic.     Right Ear: Tympanic membrane, ear canal and external ear normal.     Left Ear: Tympanic membrane and ear canal normal.     Nose: No nasal deformity, septal deviation, mucosal edema or rhinorrhea.     Right Sinus: No maxillary sinus tenderness or frontal sinus tenderness.     Left Sinus: No maxillary sinus tenderness or frontal sinus tenderness.     Mouth/Throat:     Mouth: Mucous membranes are not pale and not dry.     Pharynx: Uvula midline.  Eyes:     General:        Right eye: No discharge.        Left eye: No discharge.     Conjunctiva/sclera: Conjunctivae normal.     Right eye: Right conjunctiva is not injected. No chemosis.    Left eye: Left conjunctiva is not injected. No chemosis.    Pupils: Pupils are equal, round, and reactive to light.  Cardiovascular:     Rate and Rhythm: Normal rate and regular rhythm.     Heart sounds: Normal heart sounds.  Pulmonary:     Effort: Pulmonary effort is normal. No tachypnea, accessory muscle usage or respiratory distress.     Breath sounds: Normal breath sounds. No wheezing, rhonchi or rales.  Chest:     Chest wall: No tenderness.  Lymphadenopathy:     Cervical: No cervical adenopathy.  Skin:    Coloration: Skin is not pale.     Findings: No abrasion, erythema, petechiae or rash. Rash is not papular, urticarial or vesicular.  Neurological:     Mental Status: She is alert.     Diagnostic studies: none        Salvatore Marvel, MD  [facility] [place]

## 2022-01-05 ENCOUNTER — Telehealth: Payer: Self-pay | Admitting: *Deleted

## 2022-01-05 NOTE — Telephone Encounter (Signed)
PA has been submitted through CoverMyMeds for Carbinoxamine 4mg  and is currently pending approval/denial.

## 2022-01-06 ENCOUNTER — Other Ambulatory Visit: Payer: Self-pay | Admitting: *Deleted

## 2022-01-06 MED ORDER — LEVOCETIRIZINE DIHYDROCHLORIDE 5 MG PO TABS: 5.0000 mg | ORAL_TABLET | Freq: Every evening | ORAL | 5 refills | Status: DC

## 2022-01-06 NOTE — Telephone Encounter (Signed)
PA was denied stating that the patient must try and fail Levocetirizine. Would you like to switch to this medication?

## 2022-01-06 NOTE — Telephone Encounter (Signed)
New prescription has been sent in. Called and left a voicemail asking for patient to return call to inform.  

## 2022-01-06 NOTE — Telephone Encounter (Signed)
Sure that is fine! Take one tablet daily. Please have patient call us with an update in one month.  Salvatore Marvel, MD [facility] [place] [facility] [place]

## 2022-01-09 NOTE — Telephone Encounter (Signed)
Called and advised patient of change in medication and to call in one month with an update. Patient verbalized understanding and stated that she will do so.

## 2022-01-12 ENCOUNTER — Other Ambulatory Visit: Payer: Self-pay | Admitting: *Deleted

## 2022-01-12 ENCOUNTER — Telehealth: Payer: Self-pay | Admitting: Allergy & Immunology

## 2022-01-12 MED ORDER — FLUTICASONE PROPIONATE 50 MCG/ACT NA SUSP: 1.0000 | Freq: Every day | NASAL | 5 refills | Status: AC

## 2022-01-12 NOTE — Telephone Encounter (Signed)
Called and spoke with patient and she stated that she needed a refill of the Flonase nasal spray. I advised that I have sent in refills for her. Patient verbalized understanding.

## 2022-01-12 NOTE — Telephone Encounter (Signed)
Patient states she told nurse at last visit that she did not need a refill on nasal spray. Patient states she has run out and is in need of refill.   [facility] - [address] [place] [address] [place] [address]

## 2022-02-16 DIAGNOSIS — L720 Epidermal cyst: Secondary | ICD-10-CM | POA: Diagnosis not present

## 2022-02-16 DIAGNOSIS — L718 Other rosacea: Secondary | ICD-10-CM | POA: Diagnosis not present

## 2022-02-16 DIAGNOSIS — D225 Melanocytic nevi of trunk: Secondary | ICD-10-CM | POA: Diagnosis not present

## 2022-02-16 DIAGNOSIS — L821 Other seborrheic keratosis: Secondary | ICD-10-CM | POA: Diagnosis not present

## 2022-02-17 ENCOUNTER — Ambulatory Visit
Admission: RE | Admit: 2022-02-17 | Discharge: 2022-02-17 | Disposition: A | Discharge status: Home / Self Care | Payer: Medicare Other | Source: Ambulatory Visit | Attending: Obstetrics and Gynecology | Admitting: Obstetrics and Gynecology

## 2022-02-17 DIAGNOSIS — M8589 Other specified disorders of bone density and structure, multiple sites: Secondary | ICD-10-CM | POA: Diagnosis not present

## 2022-02-17 DIAGNOSIS — Z780 Asymptomatic menopausal state: Secondary | ICD-10-CM | POA: Diagnosis not present

## 2022-02-17 DIAGNOSIS — M8580 Other specified disorders of bone density and structure, unspecified site: Secondary | ICD-10-CM

## 2022-02-17 DIAGNOSIS — M810 Age-related osteoporosis without current pathological fracture: Secondary | ICD-10-CM | POA: Diagnosis not present

## 2022-03-30 ENCOUNTER — Ambulatory Visit (INDEPENDENT_AMBULATORY_CARE_PROVIDER_SITE_OTHER): Payer: Medicare Other | Admitting: Allergy & Immunology

## 2022-03-30 ENCOUNTER — Encounter: Payer: Self-pay | Admitting: Allergy & Immunology

## 2022-03-30 VITALS — BP 150/78 | HR 73 | Temp 98.3°F | Resp 18 | Ht 65.0 in | Wt 159.6 lb

## 2022-03-30 DIAGNOSIS — J3089 Other allergic rhinitis: Secondary | ICD-10-CM | POA: Diagnosis not present

## 2022-03-30 DIAGNOSIS — J302 Other seasonal allergic rhinitis: Secondary | ICD-10-CM

## 2022-03-30 DIAGNOSIS — L720 Epidermal cyst: Secondary | ICD-10-CM | POA: Diagnosis not present

## 2022-03-30 MED ORDER — PREDNISONE 10 MG PO TABS: ORAL_TABLET | ORAL | 0 refills | Status: DC

## 2022-03-30 MED ORDER — AMOXICILLIN 875 MG PO TABS: 875.0000 mg | ORAL_TABLET | Freq: Two times a day (BID) | ORAL | 0 refills | Status: AC

## 2022-03-30 NOTE — Progress Notes (Signed)
? ?FOLLOW UP ? ?Date of Service/Encounter:  03/30/22 ? ? ?Assessment:  ? ?Seasonal and perennial allergic rhinitis (grasses, ragweed, weeds, indoor molds, outdoor molds and dog) - interested in rush immunotherapy ?  ?Systolic ejection murmur - with largely normal echocardiogram (now resolved completely) ?  ? ?Plan/Recommendations:  ? ?1. Seasonal and perennial allergic rhinitis (grasses, ragweed, weeds, indoor molds, outdoor molds and dog) ?- Continue with: Zyrtec (cetirizine) once daily (you can continue to alternate over the counter antihistamines) and Flonase (fluticasone) one spray per nostril daily  ?- Start amoxicillin twice daily for ten days. ?- Start prednisone taper. ?- I sent both of these to your pharmacy. ?- Check Medicare coverage for RUSH immunotherapy.  ?- Otherwise, we can do traditional immunotherapy. ?- Let Stacey Cain know when you make a decision. ?  ?2. Return in about 6 months (around 09/30/2022).  ? ? ?Subjective:  ? ?Stacey Cain is a 76 y.o. female presenting today for follow up of  ?Chief Complaint  ?Patient presents with  ? Seasonal and Perennial allergic rhinitis  ?  F/u to discuss allergy injections - Patient states she feels like medications are not working - she's getting worse not better.  ? ? ?DYNASIA Stacey Cain has a history of the following: ?Patient Active Problem List  ? Diagnosis Date Noted  ? Carotid artery disease ([place])   ? Genetic testing 01/21/2018  ? Personal history of breast cancer 12/25/2017  ? Family history of ovarian cancer   ? Family history of pancreatic cancer   ? Family history of prostate cancer   ? GERD (gastroesophageal reflux disease) 01/22/2013  ? ? ?History obtained from: chart review and patient. ? ?Stacey Cain is a 76 y.o. female presenting for a follow up visit.  She was last seen in February 2023.  At that time, we continue with Zyrtec daily as well as Flonase.  We added on carbinoxamine every 8 hours as needed and alternated her other antihistamines.  We did discuss  allergy shots for long-term control. ? ?Since last visit, she has just gotten worse. She is tired of everything. She is taking 1-2 Zyrtec daily.  She has been using nose sprays without much relief.  She has tried changing her antihistamines.  However, her symptoms just keep getting worse and worse and worse.  She is interested in starting allergy shots, but the drive is still what is keeping her from doing it.  She does seem interested in rash immunotherapy related to driving.  She literally has no heart issues at all and recently completed that large work-up for the murmur.  All of the work-up was normal. ? ?She has not had any antibiotics for any sinus infections. ? ?Otherwise, there have been no changes to her past medical history, surgical history, family history, or social history. ? ? ? ?Review of Systems  ?Constitutional: Negative.  Negative for chills, fever, malaise/fatigue and weight loss.  ?HENT:  Positive for congestion, sinus pain and sore throat. Negative for ear discharge and ear pain.   ?     Positive for postnasal drip.  ?Eyes:  Negative for pain, discharge and redness.  ?Respiratory:  Negative for cough, sputum production, shortness of breath and wheezing.   ?Cardiovascular: Negative.  Negative for chest pain and palpitations.  ?Gastrointestinal:  Negative for abdominal pain, constipation, diarrhea, heartburn, nausea and vomiting.  ?Skin: Negative.  Negative for itching and rash.  ?Neurological:  Negative for dizziness and headaches.  ?Endo/Heme/Allergies:  Negative for environmental allergies. Does not  bruise/bleed easily.   ? ? ? ?Objective:  ? ?Blood pressure (!) 150/78, pulse 73, temperature 98.3 ?F (36.8 ?C), resp. rate 18, height '5\' 5"'$  (1.651 m), weight 159 lb 9.6 oz (72.4 kg), SpO2 97 %. ?Body mass index is 26.56 kg/m?. ? ? ? ?Physical Exam ?Vitals reviewed.  ?Constitutional:   ?   Appearance: She is well-developed.  ?HENT:  ?   Head: Normocephalic and atraumatic.  ?   Right Ear: Tympanic  membrane, ear canal and external ear normal. No drainage, swelling or tenderness. Tympanic membrane is not injected, scarred, erythematous, retracted or bulging.  ?   Left Ear: Tympanic membrane, ear canal and external ear normal. No drainage, swelling or tenderness. Tympanic membrane is not injected, scarred, erythematous, retracted or bulging.  ?   Nose: No nasal deformity, septal deviation, mucosal edema or rhinorrhea.  ?   Right Turbinates: Enlarged, swollen and pale.  ?   Left Turbinates: Enlarged, swollen and pale.  ?   Right Sinus: No maxillary sinus tenderness or frontal sinus tenderness.  ?   Left Sinus: No maxillary sinus tenderness or frontal sinus tenderness.  ?   Mouth/Throat:  ?   Mouth: Mucous membranes are not pale and not dry.  ?   Pharynx: Uvula midline.  ?Eyes:  ?   General:     ?   Right eye: No discharge.     ?   Left eye: No discharge.  ?   Conjunctiva/sclera: Conjunctivae normal.  ?   Right eye: Right conjunctiva is not injected. No chemosis. ?   Left eye: Left conjunctiva is not injected. No chemosis. ?   Pupils: Pupils are equal, round, and reactive to light.  ?Cardiovascular:  ?   Rate and Rhythm: Normal rate and regular rhythm.  ?   Heart sounds: Normal heart sounds.  ?Pulmonary:  ?   Effort: Pulmonary effort is normal. No tachypnea, accessory muscle usage or respiratory distress.  ?   Breath sounds: Normal breath sounds. No wheezing, rhonchi or rales.  ?Chest:  ?   Chest wall: No tenderness.  ?Abdominal:  ?   Tenderness: There is no abdominal tenderness. There is no guarding or rebound.  ?Lymphadenopathy:  ?   Head:  ?   Right side of head: No submandibular, tonsillar or occipital adenopathy.  ?   Left side of head: No submandibular, tonsillar or occipital adenopathy.  ?   Cervical: No cervical adenopathy.  ?Skin: ?   Coloration: Skin is not pale.  ?   Findings: No abrasion, erythema, petechiae or rash. Rash is not papular, urticarial or vesicular.  ?Neurological:  ?   Mental Status: She  is alert.  ?Psychiatric:     ?   Behavior: Behavior is cooperative.  ?  ? ?Diagnostic studies: none ? ? ? ?  ?Salvatore Marvel, MD  ?[facility] [place] [place] ? ? ? ? ? ? ?

## 2022-03-30 NOTE — Patient Instructions (Addendum)
1. Seasonal and perennial allergic rhinitis (grasses, ragweed, weeds, indoor molds, outdoor molds and dog) ?- Continue with: Zyrtec (cetirizine) once daily (you can continue to alternate over the counter antihistamines) and Flonase (fluticasone) one spray per nostril daily  ?- Start amoxicillin twice daily for ten days. ?- Start prednisone taper. ?- I sent both of these to your pharmacy. ?- Check Medicare coverage for RUSH immunotherapy.  ?- Otherwise, we can do traditional immunotherapy. ?- Let us know when you make a decision. ?  ?2. Return in about 6 months (around 09/30/2022).  ? ? ?Please inform us of any Emergency Department visits, hospitalizations, or changes in symptoms. Call us before going to the ED for breathing or allergy symptoms since we might be able to fit you in for a sick visit. Feel free to contact us anytime with any questions, problems, or concerns. ? ?It was a pleasure to see you again today! ? ?Websites that have reliable patient information: ?1. American Academy of Asthma, Allergy, and Immunology: www.aaaai.org ?2. Food Allergy Research and Education (FARE): foodallergy.org ?3. Mothers of Asthmatics: http://www.asthmacommunitynetwork.org ?4. [employer] of Allergy, Asthma, and Immunology: MonthlyElectricBill.co.uk ? ? ?COVID-19 Vaccine Information can be found at: ShippingScam.co.uk For questions related to vaccine distribution or appointments, please email [email or web address] or call [phone].  ? ? ? ??Like? [place] on Facebook and Instagram for our latest updates!  ?  ? ? ? ?Make sure you are registered to vote! If you have moved or changed any of your contact information, you will need to get this updated before voting! ? ?In some cases, you MAY be able to register to vote online: [email or web address] ? ? ? ? ? ?

## 2022-03-31 ENCOUNTER — Telehealth: Payer: Self-pay | Admitting: Allergy & Immunology

## 2022-03-31 NOTE — Telephone Encounter (Signed)
Patient called and stated that she is interested in the rush program and that she has talked to her insurance company about it. Patient requests a call back at [phone]. ?

## 2022-04-04 ENCOUNTER — Encounter: Payer: Self-pay | Admitting: Allergy & Immunology

## 2022-04-19 ENCOUNTER — Encounter: Payer: Self-pay | Admitting: Allergy & Immunology

## 2022-04-19 NOTE — Progress Notes (Signed)
Aeroallergen Immunotherapy   Ordering Provider: Dr. Salvatore Marvel   Patient Details  Name: Stacey Cain  MRN: [id number]  Date of Birth: 1946-09-09   Order 2 of 2   Vial Label: Molds   0.2 ml (Volume)  1:20 Concentration -- Alternaria alternata  0.2 ml (Volume)  1:20 Concentration -- Cladosporium herbarum  0.2 ml (Volume)  1:10 Concentration -- Aspergillus mix  0.2 ml (Volume)  1:10 Concentration -- Penicillium mix  0.2 ml (Volume)  1:20 Concentration -- Bipolaris sorokiniana  0.2 ml (Volume)  1:20 Concentration -- Drechslera spicifera  0.2 ml (Volume)  1:10 Concentration -- Mucor plumbeus  0.2 ml (Volume)  1:10 Concentration -- Fusarium moniliforme  0.2 ml (Volume)  1:40 Concentration -- Aureobasidium pullulans  0.2 ml (Volume)  1:10 Concentration -- Rhizopus oryzae    2.0  ml Extract Subtotal  3.0  ml Diluent  5.0  ml Maintenance Total   Schedule:  B  Silver Vial (1:1,000,000): Schedule B (6 doses)  Blue Vial (1:100,000): Schedule B (6 doses)  Yellow Vial (1:10,000): Schedule B (6 doses)  Green Vial (1:1,000): Schedule B (6 doses)  Red Vial (1:100): Schedule B (6 doses)   Special Instructions: Please proceed via rush immunotherapy protocol.

## 2022-04-19 NOTE — Progress Notes (Signed)
VIALS NOT MADE UNTIL CLOSER TO APPT.

## 2022-04-19 NOTE — Progress Notes (Signed)
Aeroallergen Immunotherapy   Ordering Provider: Dr. Salvatore Marvel   Patient Details  Name: Stacey Cain  MRN: [id number]  Date of Birth: 12/29/1945   Order 1 of 2   Vial Label: G/RW/W/D   0.2 ml (Volume)  1:20 Concentration -- Johnson  0.3 ml (Volume)  1:20 Concentration -- Ragweed Mix  0.5 ml (Volume)  1:20 Concentration -- Weed Mix*  0.5 ml (Volume)  1:10 Concentration -- Dog Epithelia    1.5  ml Extract Subtotal  3.5  ml Diluent  5.0  ml Maintenance Total   Schedule: B  Silver Vial (1:1,000,000): Schedule B (6 doses)  Blue Vial (1:100,000): Schedule B (6 doses)  Yellow Vial (1:10,000): Schedule B (6 doses)  Green Vial (1:1,000): Schedule B (6 doses)  Red Vial (1:100): Schedule B (6 doses)   Special Instructions: Please proceed via rush immunotherapy protocol.

## 2022-04-19 NOTE — Addendum Note (Signed)
Addended by: Valentina Shaggy on: 04/19/2022 01:46 PM   Modules accepted: Orders

## 2022-04-26 ENCOUNTER — Other Ambulatory Visit: Payer: Self-pay | Admitting: Family Medicine

## 2022-04-26 DIAGNOSIS — Z1231 Encounter for screening mammogram for malignant neoplasm of breast: Secondary | ICD-10-CM

## 2022-06-08 DIAGNOSIS — J3081 Allergic rhinitis due to animal (cat) (dog) hair and dander: Secondary | ICD-10-CM | POA: Diagnosis not present

## 2022-06-08 NOTE — Progress Notes (Signed)
VIALS EXP 06-09-23

## 2022-06-09 ENCOUNTER — Other Ambulatory Visit: Payer: Self-pay

## 2022-06-09 DIAGNOSIS — J302 Other seasonal allergic rhinitis: Secondary | ICD-10-CM | POA: Diagnosis not present

## 2022-06-09 MED ORDER — EPINEPHRINE 0.3 MG/0.3ML IJ SOAJ: 0.3000 mg | INTRAMUSCULAR | 1 refills | Status: DC | PRN

## 2022-06-09 MED ORDER — MONTELUKAST SODIUM 10 MG PO TABS: 10.0000 mg | ORAL_TABLET | Freq: Every day | ORAL | 0 refills | Status: DC

## 2022-06-09 MED ORDER — PREDNISONE 20 MG PO TABS: 20.0000 mg | ORAL_TABLET | Freq: Every day | ORAL | 0 refills | Status: DC

## 2022-06-13 ENCOUNTER — Ambulatory Visit
Admission: RE | Admit: 2022-06-13 | Discharge: 2022-06-13 | Disposition: A | Discharge status: Home / Self Care | Payer: Medicare Other | Source: Ambulatory Visit | Attending: Family Medicine | Admitting: Family Medicine

## 2022-06-13 DIAGNOSIS — Z1231 Encounter for screening mammogram for malignant neoplasm of breast: Secondary | ICD-10-CM | POA: Diagnosis not present

## 2022-06-19 ENCOUNTER — Telehealth: Payer: Self-pay

## 2022-06-19 NOTE — Telephone Encounter (Signed)
That is fine - she can switch to the traditional build up. But [place].   Salvatore Marvel, MD [facility] [facility] [place]

## 2022-06-19 NOTE — Telephone Encounter (Signed)
Pt canceled RUSH appt, stating she's not ready or comfortable right now doing RUSH Injections.

## 2022-06-25 ENCOUNTER — Other Ambulatory Visit: Payer: Self-pay | Admitting: Allergy & Immunology

## 2022-06-29 NOTE — Telephone Encounter (Signed)
Thanks for letting me know!   Salvatore Marvel, MD All[facility] and [place] [facility]

## 2022-06-30 ENCOUNTER — Ambulatory Visit (INDEPENDENT_AMBULATORY_CARE_PROVIDER_SITE_OTHER): Payer: Medicare Other

## 2022-06-30 ENCOUNTER — Ambulatory Visit: Payer: Medicare Other | Admitting: Internal Medicine

## 2022-06-30 DIAGNOSIS — J309 Allergic rhinitis, unspecified: Secondary | ICD-10-CM

## 2022-06-30 NOTE — Progress Notes (Signed)
Immunotherapy   Patient Details  Name: Stacey Cain MRN: [id number] Date of Birth: [birth year]  06/30/2022  Stacey Cain started injections for  G-RW-W-D and Molds. Patient received 0.05 of both her blue vials with an exp of 06/09/2023, Patient waited 30 minutes with no problems. Following schedule: B  Frequency:1 time per week Epi-Pen:Epi-Pen Available  Consent signed and patient instructions given.   Herbie Drape 06/30/2022, 8:57 AM

## 2022-07-04 ENCOUNTER — Other Ambulatory Visit: Payer: Self-pay | Admitting: *Deleted

## 2022-07-04 ENCOUNTER — Ambulatory Visit (INDEPENDENT_AMBULATORY_CARE_PROVIDER_SITE_OTHER): Payer: Medicare Other | Admitting: *Deleted

## 2022-07-04 DIAGNOSIS — J309 Allergic rhinitis, unspecified: Secondary | ICD-10-CM | POA: Diagnosis not present

## 2022-07-04 MED ORDER — EPINEPHRINE 0.3 MG/0.3ML IJ SOAJ: 0.3000 mg | INTRAMUSCULAR | 1 refills | Status: AC | PRN

## 2022-07-12 ENCOUNTER — Ambulatory Visit (INDEPENDENT_AMBULATORY_CARE_PROVIDER_SITE_OTHER): Payer: Medicare Other

## 2022-07-12 DIAGNOSIS — J309 Allergic rhinitis, unspecified: Secondary | ICD-10-CM | POA: Diagnosis not present

## 2022-07-18 ENCOUNTER — Ambulatory Visit (INDEPENDENT_AMBULATORY_CARE_PROVIDER_SITE_OTHER): Payer: Medicare Other | Admitting: *Deleted

## 2022-07-18 DIAGNOSIS — J309 Allergic rhinitis, unspecified: Secondary | ICD-10-CM

## 2022-07-27 ENCOUNTER — Ambulatory Visit (INDEPENDENT_AMBULATORY_CARE_PROVIDER_SITE_OTHER): Payer: Medicare Other

## 2022-07-27 DIAGNOSIS — J309 Allergic rhinitis, unspecified: Secondary | ICD-10-CM

## 2022-08-07 ENCOUNTER — Ambulatory Visit (INDEPENDENT_AMBULATORY_CARE_PROVIDER_SITE_OTHER): Payer: Medicare Other

## 2022-08-07 DIAGNOSIS — J309 Allergic rhinitis, unspecified: Secondary | ICD-10-CM | POA: Diagnosis not present

## 2022-08-17 ENCOUNTER — Ambulatory Visit (INDEPENDENT_AMBULATORY_CARE_PROVIDER_SITE_OTHER): Payer: Medicare Other

## 2022-08-17 DIAGNOSIS — J309 Allergic rhinitis, unspecified: Secondary | ICD-10-CM

## 2022-08-23 ENCOUNTER — Ambulatory Visit (INDEPENDENT_AMBULATORY_CARE_PROVIDER_SITE_OTHER): Payer: Medicare Other | Admitting: *Deleted

## 2022-08-23 DIAGNOSIS — J309 Allergic rhinitis, unspecified: Secondary | ICD-10-CM | POA: Diagnosis not present

## 2022-08-31 ENCOUNTER — Ambulatory Visit (INDEPENDENT_AMBULATORY_CARE_PROVIDER_SITE_OTHER): Payer: Medicare Other

## 2022-08-31 DIAGNOSIS — J309 Allergic rhinitis, unspecified: Secondary | ICD-10-CM

## 2022-09-07 ENCOUNTER — Ambulatory Visit (INDEPENDENT_AMBULATORY_CARE_PROVIDER_SITE_OTHER): Payer: Medicare Other

## 2022-09-07 DIAGNOSIS — J309 Allergic rhinitis, unspecified: Secondary | ICD-10-CM | POA: Diagnosis not present

## 2022-09-12 ENCOUNTER — Ambulatory Visit (INDEPENDENT_AMBULATORY_CARE_PROVIDER_SITE_OTHER): Payer: Medicare Other

## 2022-09-12 DIAGNOSIS — J309 Allergic rhinitis, unspecified: Secondary | ICD-10-CM | POA: Diagnosis not present

## 2022-09-19 ENCOUNTER — Ambulatory Visit (INDEPENDENT_AMBULATORY_CARE_PROVIDER_SITE_OTHER): Payer: Medicare Other | Admitting: *Deleted

## 2022-09-19 DIAGNOSIS — J309 Allergic rhinitis, unspecified: Secondary | ICD-10-CM

## 2022-09-27 ENCOUNTER — Ambulatory Visit (INDEPENDENT_AMBULATORY_CARE_PROVIDER_SITE_OTHER): Payer: Medicare Other

## 2022-09-27 DIAGNOSIS — M810 Age-related osteoporosis without current pathological fracture: Secondary | ICD-10-CM | POA: Diagnosis not present

## 2022-09-27 DIAGNOSIS — Z8041 Family history of malignant neoplasm of ovary: Secondary | ICD-10-CM | POA: Diagnosis not present

## 2022-09-27 DIAGNOSIS — J309 Allergic rhinitis, unspecified: Secondary | ICD-10-CM

## 2022-09-27 DIAGNOSIS — Z01419 Encounter for gynecological examination (general) (routine) without abnormal findings: Secondary | ICD-10-CM | POA: Diagnosis not present

## 2022-09-27 DIAGNOSIS — Z853 Personal history of malignant neoplasm of breast: Secondary | ICD-10-CM | POA: Diagnosis not present

## 2022-09-27 DIAGNOSIS — Z124 Encounter for screening for malignant neoplasm of cervix: Secondary | ICD-10-CM | POA: Diagnosis not present

## 2022-09-27 DIAGNOSIS — Z6826 Body mass index (BMI) 26.0-26.9, adult: Secondary | ICD-10-CM | POA: Diagnosis not present

## 2022-09-27 DIAGNOSIS — Z01411 Encounter for gynecological examination (general) (routine) with abnormal findings: Secondary | ICD-10-CM | POA: Diagnosis not present

## 2022-10-07 DIAGNOSIS — J019 Acute sinusitis, unspecified: Secondary | ICD-10-CM | POA: Diagnosis not present

## 2022-10-17 ENCOUNTER — Ambulatory Visit (INDEPENDENT_AMBULATORY_CARE_PROVIDER_SITE_OTHER): Payer: Medicare Other

## 2022-10-17 DIAGNOSIS — J309 Allergic rhinitis, unspecified: Secondary | ICD-10-CM

## 2022-10-25 ENCOUNTER — Ambulatory Visit (INDEPENDENT_AMBULATORY_CARE_PROVIDER_SITE_OTHER): Payer: Medicare Other

## 2022-10-25 DIAGNOSIS — J309 Allergic rhinitis, unspecified: Secondary | ICD-10-CM | POA: Diagnosis not present

## 2022-10-30 ENCOUNTER — Ambulatory Visit (INDEPENDENT_AMBULATORY_CARE_PROVIDER_SITE_OTHER): Payer: Medicare Other

## 2022-10-30 DIAGNOSIS — L718 Other rosacea: Secondary | ICD-10-CM | POA: Diagnosis not present

## 2022-10-30 DIAGNOSIS — D485 Neoplasm of uncertain behavior of skin: Secondary | ICD-10-CM | POA: Diagnosis not present

## 2022-10-30 DIAGNOSIS — J309 Allergic rhinitis, unspecified: Secondary | ICD-10-CM

## 2022-10-30 DIAGNOSIS — C44311 Basal cell carcinoma of skin of nose: Secondary | ICD-10-CM | POA: Diagnosis not present

## 2022-10-30 DIAGNOSIS — C44319 Basal cell carcinoma of skin of other parts of face: Secondary | ICD-10-CM | POA: Diagnosis not present

## 2022-11-09 ENCOUNTER — Ambulatory Visit (INDEPENDENT_AMBULATORY_CARE_PROVIDER_SITE_OTHER): Payer: Medicare Other

## 2022-11-09 DIAGNOSIS — J309 Allergic rhinitis, unspecified: Secondary | ICD-10-CM

## 2022-12-06 ENCOUNTER — Ambulatory Visit (INDEPENDENT_AMBULATORY_CARE_PROVIDER_SITE_OTHER): Payer: Medicare Other

## 2022-12-06 DIAGNOSIS — J309 Allergic rhinitis, unspecified: Secondary | ICD-10-CM | POA: Diagnosis not present

## 2022-12-12 ENCOUNTER — Other Ambulatory Visit: Payer: Self-pay

## 2022-12-12 ENCOUNTER — Ambulatory Visit (INDEPENDENT_AMBULATORY_CARE_PROVIDER_SITE_OTHER): Payer: Medicare Other | Admitting: Allergy & Immunology

## 2022-12-12 ENCOUNTER — Encounter: Payer: Self-pay | Admitting: Allergy & Immunology

## 2022-12-12 VITALS — BP 142/70 | HR 76 | Temp 98.0°F | Resp 16 | Wt 159.8 lb

## 2022-12-12 DIAGNOSIS — K219 Gastro-esophageal reflux disease without esophagitis: Secondary | ICD-10-CM | POA: Diagnosis not present

## 2022-12-12 DIAGNOSIS — J3089 Other allergic rhinitis: Secondary | ICD-10-CM

## 2022-12-12 DIAGNOSIS — J309 Allergic rhinitis, unspecified: Secondary | ICD-10-CM

## 2022-12-12 DIAGNOSIS — Z85828 Personal history of other malignant neoplasm of skin: Secondary | ICD-10-CM | POA: Diagnosis not present

## 2022-12-12 DIAGNOSIS — C44319 Basal cell carcinoma of skin of other parts of face: Secondary | ICD-10-CM | POA: Diagnosis not present

## 2022-12-12 HISTORY — PX: BASAL CELL CARCINOMA EXCISION: SHX1214

## 2022-12-12 MED ORDER — CETIRIZINE HCL 10 MG PO TABS: 10.0000 mg | ORAL_TABLET | Freq: Every day | ORAL | 5 refills | Status: DC

## 2022-12-12 NOTE — Patient Instructions (Addendum)
1. Seasonal and perennial allergic rhinitis (grasses, ragweed, weeds, indoor molds, outdoor molds and dog) - Continue with: Zyrtec (cetirizine) once daily (you can continue to alternate over the counter antihistamines) and Flonase (fluticasone) one spray per nostril daily  - I sent both of these to your pharmacy. - Try going off the nasal spray to see how you feel.  - We can do twice weekly injections to build up quicker.   2. Return in about 6 months (around 06/12/2023).    Please inform us of any Emergency Department visits, hospitalizations, or changes in symptoms. Call us before going to the ED for breathing or allergy symptoms since we might be able to fit you in for a sick visit. Feel free to contact us anytime with any questions, problems, or concerns.  It was a pleasure to see you again today!  Websites that have reliable patient information: 1. American Academy of Asthma, Allergy, and Immunology: www.aaaai.org 2. Food Allergy Research and Education (FARE): foodallergy.org 3. Mothers of Asthmatics: http://www.asthmacommunitynetwork.org 4. American College of Allergy, Asthma, and Immunology: www.acaai.org   COVID-19 Vaccine Information can be found at: ShippingScam.co.uk For questions related to vaccine distribution or appointments, please email [email or web address] or call [phone].     "Like" [place] on Facebook and Instagram for our latest updates!       Make sure you are registered to vote! If you have moved or changed any of your contact information, you will need to get this updated before voting!  In some cases, you MAY be able to register to vote online: [email or web address]

## 2022-12-12 NOTE — Progress Notes (Signed)
FOLLOW UP  Date of Service/Encounter:  12/12/22   Assessment:   Seasonal and perennial allergic rhinitis (grasses, ragweed, weeds, indoor molds, outdoor molds and dog) - on traditional immunotherapy   Systolic ejection murmur - with largely normal echocardiogram (now resolved completely)  GERD  Plan/Recommendations:   1. Seasonal and perennial allergic rhinitis (grasses, ragweed, weeds, indoor molds, outdoor molds and dog) - Continue with: Zyrtec (cetirizine) once daily (you can continue to alternate over the counter antihistamines) and Flonase (fluticasone) one spray per nostril daily  - I sent both of these to your pharmacy. - Try going off the nasal spray to see how you feel.  - We can do twice weekly injections to build up quicker.   2. Return in about 6 months (around 06/12/2023).   Subjective:   Stacey Cain is a 77 y.o. female presenting today for follow up of  Chief Complaint  Patient presents with   Follow-up    Prescribed doxycycline for today's surgery (basal cell carcinoma removal)     Stacey Cain has a history of the following: Patient Active Problem List   Diagnosis Date Noted   Carotid artery disease ([place])    Genetic testing 01/21/2018   Personal history of breast cancer 12/25/2017   Family history of ovarian cancer    Family history of pancreatic cancer    Family history of prostate cancer    GERD (gastroesophageal reflux disease) 01/22/2013    History obtained from: chart review and patient.  Stacey Cain is a 77 y.o. female presenting for a follow up visit.  She was last seen here in May 2023.  At that time, we talked about starting her on allergen immunotherapy.  She decided to do traditional buildup rather than rush immunotherapy.  She had a basal cell removed yesterday. She is in some pain from that. This was Dr. Sarajane Jews at [facility]. They did the Mohs procedure. She had the margins cleared the fist time.  She tolerated the procedure  fine.  She is allowed to use only Tylenol for pain.  Allergic Rhinitis Symptom History: She was worried about doing the rush immunotherapy so she changed to traditional.. She has a son who lives here and she tries to combine other appointments. She has a son and a grandson who live here. She missed some appointments in December when she had a virus. She takes the fluticasone and the cetirizine daily. Some days she takes a Coricidin depending on how she is feeling. This helps with the congestion.  Stacey Cain is on allergen immunotherapy. She receives two injections. Immunotherapy script #1 contains molds. She currently receives 0.73m of the GREEN vial (1/1,000). Immunotherapy script #2 contains  ragweed, weeds, grasses, and dog. She currently receives 0.332mof the GREEN vial (1/1,000). She started shots August of 2023 and has not yet reached maintenance.  She is not having any large local reaction.  She is just frustrated that her symptoms have not resolved a lot.  She has never reached the red vial at all.  She actually had to miss about 4 to 6 weeks in December and January because of illnesses.  Otherwise, there have been no changes to her past medical history, surgical history, family history, or social history.    Review of Systems  Constitutional: Negative.  Negative for chills, fever, malaise/fatigue and weight loss.  HENT:  Positive for congestion and sinus pain. Negative for ear discharge, ear pain and sore throat.  Positive for postnasal drip.  Eyes:  Negative for pain, discharge and redness.  Respiratory:  Negative for cough, sputum production, shortness of breath and wheezing.   Cardiovascular: Negative.  Negative for chest pain and palpitations.  Gastrointestinal:  Negative for abdominal pain, constipation, diarrhea, heartburn, nausea and vomiting.  Skin: Negative.  Negative for itching and rash.  Neurological:  Negative for dizziness and headaches.  Endo/Heme/Allergies:  Negative  for environmental allergies. Does not bruise/bleed easily.       Objective:   Blood pressure (!) 142/70, pulse 76, temperature 98 F (36.7 C), temperature source Temporal, resp. rate 16, weight 159 lb 12.8 oz (72.5 kg), SpO2 99 %. Body mass index is 26.59 kg/m.    Physical Exam Vitals reviewed.  Constitutional:      Appearance: She is well-developed.     Comments: Pleasant.  Cooperative with the exam.  HENT:     Head: Normocephalic and atraumatic.     Right Ear: Tympanic membrane, ear canal and external ear normal. No drainage, swelling or tenderness. Tympanic membrane is not injected, scarred, erythematous, retracted or bulging.     Left Ear: Tympanic membrane, ear canal and external ear normal. No drainage, swelling or tenderness. Tympanic membrane is not injected, scarred, erythematous, retracted or bulging.     Nose: No nasal deformity, septal deviation, mucosal edema or rhinorrhea.     Right Turbinates: Enlarged, swollen and pale.     Left Turbinates: Enlarged, swollen and pale.     Right Sinus: No maxillary sinus tenderness or frontal sinus tenderness.     Left Sinus: No maxillary sinus tenderness or frontal sinus tenderness.     Comments: No polyps.    Mouth/Throat:     Mouth: Mucous membranes are not pale and not dry.     Pharynx: Uvula midline.  Eyes:     General:        Right eye: No discharge.        Left eye: No discharge.     Conjunctiva/sclera: Conjunctivae normal.     Right eye: Right conjunctiva is not injected. No chemosis.    Left eye: Left conjunctiva is not injected. No chemosis.    Pupils: Pupils are equal, round, and reactive to light.  Cardiovascular:     Rate and Rhythm: Normal rate and regular rhythm.     Heart sounds: Normal heart sounds.  Pulmonary:     Effort: Pulmonary effort is normal. No tachypnea, accessory muscle usage or respiratory distress.     Breath sounds: Normal breath sounds. No wheezing, rhonchi or rales.  Chest:     Chest wall:  No tenderness.  Abdominal:     Tenderness: There is no abdominal tenderness. There is no guarding or rebound.  Lymphadenopathy:     Head:     Right side of head: No submandibular, tonsillar or occipital adenopathy.     Left side of head: No submandibular, tonsillar or occipital adenopathy.     Cervical: No cervical adenopathy.  Skin:    Coloration: Skin is not pale.     Findings: No abrasion, erythema, petechiae or rash. Rash is not papular, urticarial or vesicular.  Neurological:     Mental Status: She is alert.  Psychiatric:        Behavior: Behavior is cooperative.      Diagnostic studies: none        Salvatore Marvel, MD  [facility] [place] [facility] [place]

## 2022-12-18 ENCOUNTER — Ambulatory Visit (INDEPENDENT_AMBULATORY_CARE_PROVIDER_SITE_OTHER): Payer: Medicare Other

## 2022-12-18 DIAGNOSIS — J309 Allergic rhinitis, unspecified: Secondary | ICD-10-CM | POA: Diagnosis not present

## 2022-12-27 ENCOUNTER — Ambulatory Visit (INDEPENDENT_AMBULATORY_CARE_PROVIDER_SITE_OTHER): Payer: Medicare Other

## 2022-12-27 DIAGNOSIS — J309 Allergic rhinitis, unspecified: Secondary | ICD-10-CM | POA: Diagnosis not present

## 2023-01-03 ENCOUNTER — Ambulatory Visit (INDEPENDENT_AMBULATORY_CARE_PROVIDER_SITE_OTHER): Payer: Medicare Other

## 2023-01-03 DIAGNOSIS — E7800 Pure hypercholesterolemia, unspecified: Secondary | ICD-10-CM | POA: Diagnosis not present

## 2023-01-03 DIAGNOSIS — Z79899 Other long term (current) drug therapy: Secondary | ICD-10-CM | POA: Diagnosis not present

## 2023-01-03 DIAGNOSIS — M810 Age-related osteoporosis without current pathological fracture: Secondary | ICD-10-CM | POA: Diagnosis not present

## 2023-01-03 DIAGNOSIS — J309 Allergic rhinitis, unspecified: Secondary | ICD-10-CM | POA: Diagnosis not present

## 2023-01-08 ENCOUNTER — Ambulatory Visit (INDEPENDENT_AMBULATORY_CARE_PROVIDER_SITE_OTHER): Payer: Medicare Other | Admitting: *Deleted

## 2023-01-08 DIAGNOSIS — Z6826 Body mass index (BMI) 26.0-26.9, adult: Secondary | ICD-10-CM | POA: Diagnosis not present

## 2023-01-08 DIAGNOSIS — J309 Allergic rhinitis, unspecified: Secondary | ICD-10-CM | POA: Diagnosis not present

## 2023-01-08 DIAGNOSIS — Z0000 Encounter for general adult medical examination without abnormal findings: Secondary | ICD-10-CM | POA: Diagnosis not present

## 2023-01-08 DIAGNOSIS — Z79899 Other long term (current) drug therapy: Secondary | ICD-10-CM | POA: Diagnosis not present

## 2023-01-08 DIAGNOSIS — M810 Age-related osteoporosis without current pathological fracture: Secondary | ICD-10-CM | POA: Diagnosis not present

## 2023-01-08 DIAGNOSIS — R7301 Impaired fasting glucose: Secondary | ICD-10-CM | POA: Diagnosis not present

## 2023-01-08 DIAGNOSIS — R030 Elevated blood-pressure reading, without diagnosis of hypertension: Secondary | ICD-10-CM | POA: Diagnosis not present

## 2023-01-08 DIAGNOSIS — E7800 Pure hypercholesterolemia, unspecified: Secondary | ICD-10-CM | POA: Diagnosis not present

## 2023-01-15 ENCOUNTER — Ambulatory Visit (INDEPENDENT_AMBULATORY_CARE_PROVIDER_SITE_OTHER): Payer: Medicare Other

## 2023-01-15 DIAGNOSIS — J309 Allergic rhinitis, unspecified: Secondary | ICD-10-CM | POA: Diagnosis not present

## 2023-01-17 DIAGNOSIS — Z1211 Encounter for screening for malignant neoplasm of colon: Secondary | ICD-10-CM | POA: Diagnosis not present

## 2023-01-17 DIAGNOSIS — Z1212 Encounter for screening for malignant neoplasm of rectum: Secondary | ICD-10-CM | POA: Diagnosis not present

## 2023-01-31 ENCOUNTER — Ambulatory Visit (INDEPENDENT_AMBULATORY_CARE_PROVIDER_SITE_OTHER): Payer: Medicare Other

## 2023-01-31 DIAGNOSIS — E7800 Pure hypercholesterolemia, unspecified: Secondary | ICD-10-CM | POA: Diagnosis not present

## 2023-01-31 DIAGNOSIS — J309 Allergic rhinitis, unspecified: Secondary | ICD-10-CM

## 2023-01-31 DIAGNOSIS — I10 Essential (primary) hypertension: Secondary | ICD-10-CM | POA: Diagnosis not present

## 2023-01-31 DIAGNOSIS — Z6825 Body mass index (BMI) 25.0-25.9, adult: Secondary | ICD-10-CM | POA: Diagnosis not present

## 2023-02-05 ENCOUNTER — Ambulatory Visit (INDEPENDENT_AMBULATORY_CARE_PROVIDER_SITE_OTHER): Payer: Medicare Other | Admitting: *Deleted

## 2023-02-05 DIAGNOSIS — J309 Allergic rhinitis, unspecified: Secondary | ICD-10-CM | POA: Diagnosis not present

## 2023-02-07 ENCOUNTER — Encounter: Payer: Self-pay | Admitting: Internal Medicine

## 2023-02-15 ENCOUNTER — Ambulatory Visit (INDEPENDENT_AMBULATORY_CARE_PROVIDER_SITE_OTHER): Payer: Medicare Other

## 2023-02-15 DIAGNOSIS — J309 Allergic rhinitis, unspecified: Secondary | ICD-10-CM | POA: Diagnosis not present

## 2023-02-21 ENCOUNTER — Ambulatory Visit (INDEPENDENT_AMBULATORY_CARE_PROVIDER_SITE_OTHER): Payer: Medicare Other

## 2023-02-21 DIAGNOSIS — D235 Other benign neoplasm of skin of trunk: Secondary | ICD-10-CM | POA: Diagnosis not present

## 2023-02-21 DIAGNOSIS — L821 Other seborrheic keratosis: Secondary | ICD-10-CM | POA: Diagnosis not present

## 2023-02-21 DIAGNOSIS — L859 Epidermal thickening, unspecified: Secondary | ICD-10-CM | POA: Diagnosis not present

## 2023-02-21 DIAGNOSIS — D3617 Benign neoplasm of peripheral nerves and autonomic nervous system of trunk, unspecified: Secondary | ICD-10-CM | POA: Diagnosis not present

## 2023-02-21 DIAGNOSIS — D485 Neoplasm of uncertain behavior of skin: Secondary | ICD-10-CM | POA: Diagnosis not present

## 2023-02-21 DIAGNOSIS — J309 Allergic rhinitis, unspecified: Secondary | ICD-10-CM | POA: Diagnosis not present

## 2023-02-21 DIAGNOSIS — Z85828 Personal history of other malignant neoplasm of skin: Secondary | ICD-10-CM | POA: Diagnosis not present

## 2023-02-21 DIAGNOSIS — D1801 Hemangioma of skin and subcutaneous tissue: Secondary | ICD-10-CM | POA: Diagnosis not present

## 2023-02-27 ENCOUNTER — Ambulatory Visit (INDEPENDENT_AMBULATORY_CARE_PROVIDER_SITE_OTHER): Payer: Medicare Other

## 2023-02-27 DIAGNOSIS — J309 Allergic rhinitis, unspecified: Secondary | ICD-10-CM

## 2023-03-07 ENCOUNTER — Ambulatory Visit (INDEPENDENT_AMBULATORY_CARE_PROVIDER_SITE_OTHER): Payer: Medicare Other

## 2023-03-07 DIAGNOSIS — J309 Allergic rhinitis, unspecified: Secondary | ICD-10-CM | POA: Diagnosis not present

## 2023-03-19 ENCOUNTER — Ambulatory Visit (INDEPENDENT_AMBULATORY_CARE_PROVIDER_SITE_OTHER): Payer: Medicare Other

## 2023-03-19 DIAGNOSIS — J309 Allergic rhinitis, unspecified: Secondary | ICD-10-CM

## 2023-03-26 ENCOUNTER — Ambulatory Visit (INDEPENDENT_AMBULATORY_CARE_PROVIDER_SITE_OTHER): Payer: Medicare Other

## 2023-03-26 DIAGNOSIS — J309 Allergic rhinitis, unspecified: Secondary | ICD-10-CM

## 2023-03-28 DIAGNOSIS — J3081 Allergic rhinitis due to animal (cat) (dog) hair and dander: Secondary | ICD-10-CM | POA: Diagnosis not present

## 2023-03-28 NOTE — Progress Notes (Signed)
EXP 03/27/24 

## 2023-03-29 DIAGNOSIS — J302 Other seasonal allergic rhinitis: Secondary | ICD-10-CM | POA: Diagnosis not present

## 2023-04-04 ENCOUNTER — Ambulatory Visit (INDEPENDENT_AMBULATORY_CARE_PROVIDER_SITE_OTHER): Payer: Medicare Other

## 2023-04-04 DIAGNOSIS — J309 Allergic rhinitis, unspecified: Secondary | ICD-10-CM

## 2023-04-11 ENCOUNTER — Ambulatory Visit (INDEPENDENT_AMBULATORY_CARE_PROVIDER_SITE_OTHER): Payer: Medicare Other | Admitting: *Deleted

## 2023-04-11 DIAGNOSIS — J309 Allergic rhinitis, unspecified: Secondary | ICD-10-CM | POA: Diagnosis not present

## 2023-04-19 ENCOUNTER — Ambulatory Visit (INDEPENDENT_AMBULATORY_CARE_PROVIDER_SITE_OTHER): Payer: Medicare Other

## 2023-04-19 DIAGNOSIS — J309 Allergic rhinitis, unspecified: Secondary | ICD-10-CM | POA: Diagnosis not present

## 2023-04-26 ENCOUNTER — Ambulatory Visit (INDEPENDENT_AMBULATORY_CARE_PROVIDER_SITE_OTHER): Payer: Medicare Other

## 2023-04-26 DIAGNOSIS — J309 Allergic rhinitis, unspecified: Secondary | ICD-10-CM

## 2023-04-30 ENCOUNTER — Ambulatory Visit (INDEPENDENT_AMBULATORY_CARE_PROVIDER_SITE_OTHER): Payer: Medicare Other | Admitting: *Deleted

## 2023-04-30 DIAGNOSIS — J309 Allergic rhinitis, unspecified: Secondary | ICD-10-CM | POA: Diagnosis not present

## 2023-05-01 ENCOUNTER — Other Ambulatory Visit: Payer: Self-pay | Admitting: Family Medicine

## 2023-05-01 DIAGNOSIS — Z1231 Encounter for screening mammogram for malignant neoplasm of breast: Secondary | ICD-10-CM

## 2023-05-09 ENCOUNTER — Ambulatory Visit (INDEPENDENT_AMBULATORY_CARE_PROVIDER_SITE_OTHER): Payer: Medicare Other

## 2023-05-09 DIAGNOSIS — J309 Allergic rhinitis, unspecified: Secondary | ICD-10-CM | POA: Diagnosis not present

## 2023-05-16 ENCOUNTER — Ambulatory Visit (INDEPENDENT_AMBULATORY_CARE_PROVIDER_SITE_OTHER): Payer: Medicare Other

## 2023-05-16 DIAGNOSIS — J309 Allergic rhinitis, unspecified: Secondary | ICD-10-CM | POA: Diagnosis not present

## 2023-05-28 ENCOUNTER — Ambulatory Visit (INDEPENDENT_AMBULATORY_CARE_PROVIDER_SITE_OTHER): Payer: Medicare Other

## 2023-05-28 DIAGNOSIS — J309 Allergic rhinitis, unspecified: Secondary | ICD-10-CM | POA: Diagnosis not present

## 2023-06-04 ENCOUNTER — Ambulatory Visit (INDEPENDENT_AMBULATORY_CARE_PROVIDER_SITE_OTHER): Payer: Medicare Other

## 2023-06-04 DIAGNOSIS — J309 Allergic rhinitis, unspecified: Secondary | ICD-10-CM

## 2023-06-12 ENCOUNTER — Other Ambulatory Visit: Payer: Self-pay

## 2023-06-12 ENCOUNTER — Ambulatory Visit (INDEPENDENT_AMBULATORY_CARE_PROVIDER_SITE_OTHER): Payer: Medicare Other | Admitting: Allergy & Immunology

## 2023-06-12 ENCOUNTER — Ambulatory Visit (INDEPENDENT_AMBULATORY_CARE_PROVIDER_SITE_OTHER): Payer: Medicare Other

## 2023-06-12 ENCOUNTER — Encounter: Payer: Self-pay | Admitting: Allergy & Immunology

## 2023-06-12 VITALS — BP 120/80 | HR 77 | Temp 98.5°F | Ht 66.0 in | Wt 157.9 lb

## 2023-06-12 DIAGNOSIS — J309 Allergic rhinitis, unspecified: Secondary | ICD-10-CM | POA: Diagnosis not present

## 2023-06-12 DIAGNOSIS — R0981 Nasal congestion: Secondary | ICD-10-CM | POA: Diagnosis not present

## 2023-06-12 DIAGNOSIS — J302 Other seasonal allergic rhinitis: Secondary | ICD-10-CM | POA: Diagnosis not present

## 2023-06-12 DIAGNOSIS — J3089 Other allergic rhinitis: Secondary | ICD-10-CM | POA: Diagnosis not present

## 2023-06-12 MED ORDER — XHANCE 93 MCG/ACT NA EXHU: 1.0000 | INHALANT_SUSPENSION | Freq: Every day | NASAL | 5 refills | Status: AC

## 2023-06-12 MED ORDER — CETIRIZINE HCL 10 MG PO TABS: 10.0000 mg | ORAL_TABLET | Freq: Every day | ORAL | 5 refills | Status: DC

## 2023-06-12 NOTE — Progress Notes (Signed)
FOLLOW UP  Date of Service/Encounter:  06/12/23   Assessment:   Seasonal and perennial allergic rhinitis (grasses, ragweed, weeds, indoor molds, outdoor molds and dog) - on traditional immunotherapy with maintenance reached February 2024  Chronic rhinitis - ordering sinus CT today   Systolic ejection murmur - with largely normal echocardiogram (now resolved completely)   GERD    Plan/Recommendations:   1. Seasonal and perennial allergic rhinitis (grasses, ragweed, weeds, indoor molds, outdoor molds and dog) - We will increase the ragweed in your next set of vials as well as the grass pollen. - We will also order a sinus CT to see if we are missing somehting that needs an ENT visit.  - Continue with: Zyrtec (cetirizine) once daily (you can continue to alternate over the counter antihistamines)  - Starting taking: Xhance one spray per nostril every night to see if this combination can help.  2. Return in about 6 months (around 12/13/2023).    Subjective:   Stacey Cain is a 77 y.o. female presenting today for follow up of  Chief Complaint  Patient presents with   Allergies    She gets allergy shots. She changes antihistamines. Interchanging betweeen Zyrtec, Allegra, and Xyzal    Stacey Cain has a history of the following: Patient Active Problem List   Diagnosis Date Noted   Carotid artery disease ([facility])    Genetic testing 01/21/2018   Personal history of breast cancer 12/25/2017   Family history of ovarian cancer    Family history of pancreatic cancer    Family history of prostate cancer    GERD (gastroesophageal reflux disease) 01/22/2013    History obtained from: chart review and patient.  Stacey Cain is a 77 y.o. female presenting for a follow up visit.  She was last seen in January 2024.  At that time, we continue with Zyrtec as well as Flonase.  She remained on her allergen immunotherapy.  We recommended stopping the nose spray to see how she did.   Since last  visit, she has done well.   Allergic Rhinitis Symptom History: She is either on Allegra or Xyzal daily. She did not restart her Flonase at all.  She started having some large local reactions. She restarted the antihistamines and these are better. She does not feel that bad, but she always has the "gunk" in her throat. She felt that there was stuff running down her throat more than anything. Now she is having more nasal congestion. She does use Coricidin around 2-3 times per week. During the day, her symptoms are good. The worst time of the day is night and morning time. She does not have issues with sleeping at night.  She is not really clear on what her worst time of the year is, but she thinks it is mostly the late spring or early summer as well as fall.  She has not had a sinus CT of her sinuses. She has never had sinus surgery.   Stacey Cain is on allergen immunotherapy. She receives two injections. Immunotherapy script #1 contains molds. She currently receives 0.93mL of the RED vial (1/100). Immunotherapy script #2 contains  ragweed, weeds, grasses, and dog. She currently receives 0.46mL of the RED vial (1/100). She started shots August of 2023 and reached maintenance in February 2024.   Otherwise, there have been no changes to her past medical history, surgical history, family history, or social history.    Review of systems otherwise negative other than that mentioned in  the HPI.    Objective:   Blood pressure 120/80, pulse 77, temperature 98.5 F (36.9 C), temperature source Temporal, height 5\' 6"  (1.676 m), weight 157 lb 14.4 oz (71.6 kg), SpO2 96%. Body mass index is 25.49 kg/m.    Physical Exam Vitals reviewed.  Constitutional:      Appearance: She is well-developed.     Comments: Very lovely.   HENT:     Head: Normocephalic and atraumatic.     Right Ear: Tympanic membrane, ear canal and external ear normal. No drainage, swelling or tenderness. Tympanic membrane is not injected,  scarred, erythematous, retracted or bulging.     Left Ear: Tympanic membrane, ear canal and external ear normal. No drainage, swelling or tenderness. Tympanic membrane is not injected, scarred, erythematous, retracted or bulging.     Nose: No nasal deformity, septal deviation, mucosal edema or rhinorrhea.     Right Turbinates: Enlarged and swollen. Not pale.     Left Turbinates: Enlarged and swollen. Not pale.     Right Sinus: No maxillary sinus tenderness or frontal sinus tenderness.     Left Sinus: No maxillary sinus tenderness or frontal sinus tenderness.     Comments: No polyps. Her nose actually looks better than subsequent exams.     Mouth/Throat:     Mouth: Mucous membranes are not pale and not dry.     Pharynx: Uvula midline.  Eyes:     General:        Right eye: No discharge.        Left eye: No discharge.     Conjunctiva/sclera: Conjunctivae normal.     Right eye: Right conjunctiva is not injected. No chemosis.    Left eye: Left conjunctiva is not injected. No chemosis.    Pupils: Pupils are equal, round, and reactive to light.  Cardiovascular:     Rate and Rhythm: Normal rate and regular rhythm.     Heart sounds: Normal heart sounds.  Pulmonary:     Effort: Pulmonary effort is normal. No tachypnea, accessory muscle usage or respiratory distress.     Breath sounds: Normal breath sounds. No wheezing, rhonchi or rales.  Chest:     Chest wall: No tenderness.  Abdominal:     Tenderness: There is no abdominal tenderness. There is no guarding or rebound.  Lymphadenopathy:     Head:     Right side of head: No submandibular, tonsillar or occipital adenopathy.     Left side of head: No submandibular, tonsillar or occipital adenopathy.     Cervical: No cervical adenopathy.  Skin:    Coloration: Skin is not pale.     Findings: No abrasion, erythema, petechiae or rash. Rash is not papular, urticarial or vesicular.  Neurological:     Mental Status: She is alert.  Psychiatric:         Behavior: Behavior is cooperative.      Diagnostic studies: none        Malachi Bonds, MD  [facility]

## 2023-06-12 NOTE — Patient Instructions (Addendum)
1. Seasonal and perennial allergic rhinitis (grasses, ragweed, weeds, indoor molds, outdoor molds and dog) - We will increase the ragweed in your next set of vials as well as the grass pollen. - We will also order a sinus CT to see if we are missing somehting that needs an ENT visit.  - Continue with: Zyrtec (cetirizine) once daily (you can continue to alternate over the counter antihistamines)  - Starting taking: Xhance one spray per nostril every night to see if this combination can help.  2. Return in about 6 months (around 12/13/2023).    Please inform us of any Emergency Department visits, hospitalizations, or changes in symptoms. Call us before going to the ED for breathing or allergy symptoms since we might be able to fit you in for a sick visit. Feel free to contact us anytime with any questions, problems, or concerns.  It was a pleasure to see you again today!  Websites that have reliable patient information: 1. American Academy of Asthma, Allergy, and Immunology: www.aaaai.org 2. Food Allergy Research and Education (FARE): foodallergy.org 3. Mothers of Asthmatics: http://www.asthmacommunitynetwork.org 4. American College of Allergy, Asthma, and Immunology: www.acaai.org   COVID-19 Vaccine Information can be found at: [email or web address].[email or web address] For questions related to vaccine distribution or appointments, please email [email or web address] or call [phone].     "Like" [place] on Facebook and Instagram for our latest updates!       Make sure you are registered to vote! If you have moved or changed any of your contact information, you will need to get this updated before voting!  In some cases, you MAY be able to register to vote online: [email or web address]

## 2023-06-13 ENCOUNTER — Other Ambulatory Visit: Payer: Self-pay

## 2023-06-13 MED ORDER — CETIRIZINE HCL 10 MG PO TABS: 10.0000 mg | ORAL_TABLET | Freq: Every day | ORAL | 5 refills | Status: DC

## 2023-06-14 ENCOUNTER — Telehealth: Payer: Self-pay | Admitting: *Deleted

## 2023-06-14 NOTE — Progress Notes (Signed)
VIAL NOT MADE UNTIL NEEDED.

## 2023-06-14 NOTE — Progress Notes (Signed)
Aeroallergen Immunotherapy (**NOTE NEW SCRIPT**)  Ordering Provider: Dr. Malachi Bonds  Patient Details Name: Stacey Cain MRN: [id number] Date of Birth: 1946-10-29  Order 1 of 2  Vial Label: G/RW/W/D  0.4 ml (Volume)  1:20 Concentration -- Johnson 0.6 ml (Volume)  1:20 Concentration -- Ragweed Mix 0.7 ml (Volume)  1:20 Concentration -- Weed Mix* 0.5 ml (Volume)  1:10 Concentration -- Dog Epithelia   2.2  ml Extract Subtotal 2.8  ml Diluent 5.0  ml Maintenance Total   Schedule:  C  Red Vial (1:100): Schedule C (5 doses)  Special Instructions: Please start the first vial at Red Vial 0.05 and advance on Schedule B. Ok to up dose subsequent new vials at 0.16mL --> 0.3 mL --> 0.5 mL.

## 2023-06-14 NOTE — Telephone Encounter (Signed)
Currently working on [employer], retail for office PTAN.

## 2023-06-14 NOTE — Telephone Encounter (Signed)
-----   Message from Alfonse Spruce sent at 06/14/2023  6:03 AM EDT ----- Sinus CT ordered. Unsure if there is a PA required.

## 2023-06-15 ENCOUNTER — Ambulatory Visit
Admission: RE | Admit: 2023-06-15 | Discharge: 2023-06-15 | Disposition: A | Discharge status: Home / Self Care | Payer: Medicare Other | Source: Ambulatory Visit | Attending: Family Medicine | Admitting: Family Medicine

## 2023-06-15 DIAGNOSIS — Z1231 Encounter for screening mammogram for malignant neoplasm of breast: Secondary | ICD-10-CM

## 2023-06-15 NOTE — Telephone Encounter (Signed)
Tried calling pts Medicare [phone] to see if PA is required for CT Maxillofacial CPT [phone] for Chronic nasal congestion R09.81 there system is down at the moment. This will need to be rechecked at a later time.

## 2023-06-15 NOTE — Telephone Encounter (Signed)
[email or web address]  This is the [email or web address] website that the [facility] department told me to check... since there system was down.  It looks like it is covered?

## 2023-06-18 NOTE — Telephone Encounter (Signed)
I will call insurance today and double check to make sure everything is covered if there system is up and running.

## 2023-06-18 NOTE — Telephone Encounter (Signed)
I called and spoke with insurance and they did confirm that a PA was not needed for the CT Maxillofacial without contrast. They did provide me with a reference number of [id number]. The patient has already been scheduled for the CT with [facility]. I called and spoke with their scheduling department and provided the Reference number to have on file.

## 2023-06-26 ENCOUNTER — Ambulatory Visit: Payer: Self-pay

## 2023-06-26 ENCOUNTER — Ambulatory Visit
Admission: RE | Admit: 2023-06-26 | Discharge: 2023-06-26 | Disposition: A | Discharge status: Home / Self Care | Payer: Medicare Other | Source: Ambulatory Visit | Attending: Allergy & Immunology | Admitting: Allergy & Immunology

## 2023-06-26 DIAGNOSIS — R0981 Nasal congestion: Secondary | ICD-10-CM

## 2023-06-26 DIAGNOSIS — J309 Allergic rhinitis, unspecified: Secondary | ICD-10-CM

## 2023-06-26 DIAGNOSIS — J329 Chronic sinusitis, unspecified: Secondary | ICD-10-CM | POA: Diagnosis not present

## 2023-06-27 ENCOUNTER — Other Ambulatory Visit (HOSPITAL_COMMUNITY): Payer: Self-pay

## 2023-07-04 ENCOUNTER — Ambulatory Visit (INDEPENDENT_AMBULATORY_CARE_PROVIDER_SITE_OTHER): Payer: Medicare Other

## 2023-07-04 DIAGNOSIS — J309 Allergic rhinitis, unspecified: Secondary | ICD-10-CM

## 2023-07-05 ENCOUNTER — Other Ambulatory Visit (HOSPITAL_COMMUNITY): Payer: Self-pay

## 2023-07-06 ENCOUNTER — Telehealth: Payer: Self-pay

## 2023-07-06 ENCOUNTER — Other Ambulatory Visit (HOSPITAL_COMMUNITY): Payer: Self-pay

## 2023-07-06 NOTE — Telephone Encounter (Signed)
Pharmacy Patient Advocate Encounter   Received notification from CoverMyMeds that prior authorization for [facility] 93MCG/ACT exhaler suspension is required/requested.   Insurance verification completed.   The patient is insured through  [employer]  .   Per test claim: PA required; PA submitted to [employer] via CoverMyMeds Key/confirmation #/EOC [id number] Status is pending

## 2023-07-09 ENCOUNTER — Other Ambulatory Visit (HOSPITAL_COMMUNITY): Payer: Self-pay

## 2023-07-09 NOTE — Telephone Encounter (Signed)
Pharmacy Patient Advocate Encounter  Received notification from  [facility]  that Prior Authorization for [facility] 93MCG/ACT exhaler suspensionhas been APPROVED from 11-27-2022 to 11-27-2023, unless notified otherwise. Ran test claim, Copay is $n/a Pharmacy not contracted. This test claim was processed through [facility]- copay amounts may vary at other pharmacies due to pharmacy/plan contracts, or as the patient moves through the different stages of their insurance plan.   PA #/Case ID/Reference #: [id number]

## 2023-07-09 NOTE — Telephone Encounter (Signed)
I called patient to inform. I left a message to call the office back

## 2023-07-09 NOTE — Telephone Encounter (Signed)
Patient called the office back and I informed that the [place] PA has been approved.

## 2023-07-12 ENCOUNTER — Encounter: Payer: Self-pay | Admitting: Allergy & Immunology

## 2023-08-17 ENCOUNTER — Telehealth: Payer: Self-pay | Admitting: *Deleted

## 2023-08-17 NOTE — Telephone Encounter (Signed)
-----   Message from Nurse Vonzell Schlatter sent at 06/14/2023  7:25 PM EDT ----- Lorain Childes ----- Message ----- From: Alfonse Spruce, MD Sent: 06/14/2023   6:01 AM EDT To: Dub Mikes, LPN; [facility]  NOTE NEW SCRIPT. Do not mix until she needs a new set of vials. You can keep both new sets of vials on the same schedule until she reaches 0.5 mL of each Red Vial. Then change to the [facility] Method for subsequent vials. OK to space to every two weeks once she reaches 0.5 mL of her Red Vial of her next set of vials.

## 2023-08-17 NOTE — Telephone Encounter (Signed)
Noted  

## 2023-08-20 DIAGNOSIS — H53143 Visual discomfort, bilateral: Secondary | ICD-10-CM | POA: Diagnosis not present

## 2023-08-20 DIAGNOSIS — H524 Presbyopia: Secondary | ICD-10-CM | POA: Diagnosis not present

## 2023-08-20 DIAGNOSIS — H1045 Other chronic allergic conjunctivitis: Secondary | ICD-10-CM | POA: Diagnosis not present

## 2023-11-12 DIAGNOSIS — I788 Other diseases of capillaries: Secondary | ICD-10-CM | POA: Diagnosis not present

## 2023-11-12 DIAGNOSIS — L820 Inflamed seborrheic keratosis: Secondary | ICD-10-CM | POA: Diagnosis not present

## 2023-11-12 DIAGNOSIS — L821 Other seborrheic keratosis: Secondary | ICD-10-CM | POA: Diagnosis not present

## 2023-11-12 DIAGNOSIS — Z85828 Personal history of other malignant neoplasm of skin: Secondary | ICD-10-CM | POA: Diagnosis not present

## 2023-12-18 ENCOUNTER — Ambulatory Visit: Payer: Medicare Other | Admitting: Allergy & Immunology

## 2024-01-01 DIAGNOSIS — Z124 Encounter for screening for malignant neoplasm of cervix: Secondary | ICD-10-CM | POA: Diagnosis not present

## 2024-01-01 DIAGNOSIS — Z1331 Encounter for screening for depression: Secondary | ICD-10-CM | POA: Diagnosis not present

## 2024-01-01 DIAGNOSIS — Z01419 Encounter for gynecological examination (general) (routine) without abnormal findings: Secondary | ICD-10-CM | POA: Diagnosis not present

## 2024-01-09 DIAGNOSIS — E7800 Pure hypercholesterolemia, unspecified: Secondary | ICD-10-CM | POA: Diagnosis not present

## 2024-01-09 DIAGNOSIS — Z79899 Other long term (current) drug therapy: Secondary | ICD-10-CM | POA: Diagnosis not present

## 2024-01-09 DIAGNOSIS — R7301 Impaired fasting glucose: Secondary | ICD-10-CM | POA: Diagnosis not present

## 2024-01-14 ENCOUNTER — Other Ambulatory Visit: Payer: Self-pay | Admitting: Family Medicine

## 2024-01-14 DIAGNOSIS — M810 Age-related osteoporosis without current pathological fracture: Secondary | ICD-10-CM | POA: Diagnosis not present

## 2024-01-14 DIAGNOSIS — R7301 Impaired fasting glucose: Secondary | ICD-10-CM | POA: Diagnosis not present

## 2024-01-14 DIAGNOSIS — E7800 Pure hypercholesterolemia, unspecified: Secondary | ICD-10-CM | POA: Diagnosis not present

## 2024-01-14 DIAGNOSIS — I10 Essential (primary) hypertension: Secondary | ICD-10-CM | POA: Diagnosis not present

## 2024-01-14 DIAGNOSIS — Z0000 Encounter for general adult medical examination without abnormal findings: Secondary | ICD-10-CM | POA: Diagnosis not present

## 2024-02-15 DIAGNOSIS — I10 Essential (primary) hypertension: Secondary | ICD-10-CM | POA: Diagnosis not present

## 2024-02-25 DIAGNOSIS — I10 Essential (primary) hypertension: Secondary | ICD-10-CM | POA: Diagnosis not present

## 2024-02-25 DIAGNOSIS — M810 Age-related osteoporosis without current pathological fracture: Secondary | ICD-10-CM | POA: Diagnosis not present

## 2024-02-25 DIAGNOSIS — E7800 Pure hypercholesterolemia, unspecified: Secondary | ICD-10-CM | POA: Diagnosis not present

## 2024-03-05 ENCOUNTER — Other Ambulatory Visit: Payer: Self-pay | Admitting: Allergy & Immunology

## 2024-03-05 DIAGNOSIS — L812 Freckles: Secondary | ICD-10-CM | POA: Diagnosis not present

## 2024-03-05 DIAGNOSIS — D2261 Melanocytic nevi of right upper limb, including shoulder: Secondary | ICD-10-CM | POA: Diagnosis not present

## 2024-03-05 DIAGNOSIS — D2262 Melanocytic nevi of left upper limb, including shoulder: Secondary | ICD-10-CM | POA: Diagnosis not present

## 2024-03-05 DIAGNOSIS — L821 Other seborrheic keratosis: Secondary | ICD-10-CM | POA: Diagnosis not present

## 2024-03-05 DIAGNOSIS — Z85828 Personal history of other malignant neoplasm of skin: Secondary | ICD-10-CM | POA: Diagnosis not present

## 2024-03-05 DIAGNOSIS — D3615 Benign neoplasm of peripheral nerves and autonomic nervous system of abdomen: Secondary | ICD-10-CM | POA: Diagnosis not present

## 2024-03-05 DIAGNOSIS — D225 Melanocytic nevi of trunk: Secondary | ICD-10-CM | POA: Diagnosis not present

## 2024-03-05 DIAGNOSIS — D1801 Hemangioma of skin and subcutaneous tissue: Secondary | ICD-10-CM | POA: Diagnosis not present

## 2024-03-05 DIAGNOSIS — D2271 Melanocytic nevi of right lower limb, including hip: Secondary | ICD-10-CM | POA: Diagnosis not present

## 2024-03-05 DIAGNOSIS — D2272 Melanocytic nevi of left lower limb, including hip: Secondary | ICD-10-CM | POA: Diagnosis not present

## 2024-03-05 DIAGNOSIS — I788 Other diseases of capillaries: Secondary | ICD-10-CM | POA: Diagnosis not present

## 2024-03-15 DIAGNOSIS — I10 Essential (primary) hypertension: Secondary | ICD-10-CM | POA: Diagnosis not present

## 2024-03-26 DIAGNOSIS — E7800 Pure hypercholesterolemia, unspecified: Secondary | ICD-10-CM | POA: Diagnosis not present

## 2024-03-26 DIAGNOSIS — I10 Essential (primary) hypertension: Secondary | ICD-10-CM | POA: Diagnosis not present

## 2024-03-26 DIAGNOSIS — M810 Age-related osteoporosis without current pathological fracture: Secondary | ICD-10-CM | POA: Diagnosis not present

## 2024-04-14 DIAGNOSIS — I10 Essential (primary) hypertension: Secondary | ICD-10-CM | POA: Diagnosis not present

## 2024-04-26 DIAGNOSIS — E7800 Pure hypercholesterolemia, unspecified: Secondary | ICD-10-CM | POA: Diagnosis not present

## 2024-04-26 DIAGNOSIS — M810 Age-related osteoporosis without current pathological fracture: Secondary | ICD-10-CM | POA: Diagnosis not present

## 2024-04-26 DIAGNOSIS — I10 Essential (primary) hypertension: Secondary | ICD-10-CM | POA: Diagnosis not present

## 2024-05-01 ENCOUNTER — Other Ambulatory Visit: Payer: Self-pay | Admitting: Family Medicine

## 2024-05-01 ENCOUNTER — Other Ambulatory Visit: Payer: Self-pay | Admitting: Allergy & Immunology

## 2024-05-01 DIAGNOSIS — Z1231 Encounter for screening mammogram for malignant neoplasm of breast: Secondary | ICD-10-CM

## 2024-05-14 DIAGNOSIS — I10 Essential (primary) hypertension: Secondary | ICD-10-CM | POA: Diagnosis not present

## 2024-05-26 DIAGNOSIS — M810 Age-related osteoporosis without current pathological fracture: Secondary | ICD-10-CM | POA: Diagnosis not present

## 2024-05-26 DIAGNOSIS — E7800 Pure hypercholesterolemia, unspecified: Secondary | ICD-10-CM | POA: Diagnosis not present

## 2024-05-26 DIAGNOSIS — I10 Essential (primary) hypertension: Secondary | ICD-10-CM | POA: Diagnosis not present

## 2024-06-13 DIAGNOSIS — I10 Essential (primary) hypertension: Secondary | ICD-10-CM | POA: Diagnosis not present

## 2024-06-16 ENCOUNTER — Ambulatory Visit
Admission: RE | Admit: 2024-06-16 | Discharge: 2024-06-16 | Disposition: A | Discharge status: Home / Self Care | Source: Ambulatory Visit | Attending: Family Medicine | Admitting: Family Medicine

## 2024-06-16 DIAGNOSIS — Z1231 Encounter for screening mammogram for malignant neoplasm of breast: Secondary | ICD-10-CM | POA: Diagnosis not present

## 2024-06-26 DIAGNOSIS — I10 Essential (primary) hypertension: Secondary | ICD-10-CM | POA: Diagnosis not present

## 2024-06-26 DIAGNOSIS — E7800 Pure hypercholesterolemia, unspecified: Secondary | ICD-10-CM | POA: Diagnosis not present

## 2024-06-26 DIAGNOSIS — M810 Age-related osteoporosis without current pathological fracture: Secondary | ICD-10-CM | POA: Diagnosis not present

## 2024-07-13 DIAGNOSIS — I10 Essential (primary) hypertension: Secondary | ICD-10-CM | POA: Diagnosis not present

## 2024-07-27 DIAGNOSIS — E7800 Pure hypercholesterolemia, unspecified: Secondary | ICD-10-CM | POA: Diagnosis not present

## 2024-07-27 DIAGNOSIS — M810 Age-related osteoporosis without current pathological fracture: Secondary | ICD-10-CM | POA: Diagnosis not present

## 2024-07-27 DIAGNOSIS — I10 Essential (primary) hypertension: Secondary | ICD-10-CM | POA: Diagnosis not present

## 2024-08-12 ENCOUNTER — Ambulatory Visit (HOSPITAL_BASED_OUTPATIENT_CLINIC_OR_DEPARTMENT_OTHER)
Admission: RE | Admit: 2024-08-12 | Discharge: 2024-08-12 | Disposition: A | Discharge status: Home / Self Care | Source: Ambulatory Visit | Attending: Family Medicine | Admitting: Family Medicine

## 2024-08-12 DIAGNOSIS — M810 Age-related osteoporosis without current pathological fracture: Secondary | ICD-10-CM | POA: Insufficient documentation

## 2024-08-12 DIAGNOSIS — Z780 Asymptomatic menopausal state: Secondary | ICD-10-CM | POA: Diagnosis not present

## 2024-08-16 DIAGNOSIS — I10 Essential (primary) hypertension: Secondary | ICD-10-CM | POA: Diagnosis not present

## 2024-08-26 DIAGNOSIS — E7800 Pure hypercholesterolemia, unspecified: Secondary | ICD-10-CM | POA: Diagnosis not present

## 2024-08-26 DIAGNOSIS — M810 Age-related osteoporosis without current pathological fracture: Secondary | ICD-10-CM | POA: Diagnosis not present

## 2024-08-26 DIAGNOSIS — I10 Essential (primary) hypertension: Secondary | ICD-10-CM | POA: Diagnosis not present

## 2024-09-08 ENCOUNTER — Other Ambulatory Visit: Payer: Medicare Other

## 2024-09-15 DIAGNOSIS — I10 Essential (primary) hypertension: Secondary | ICD-10-CM | POA: Diagnosis not present

## 2024-09-26 DIAGNOSIS — E7800 Pure hypercholesterolemia, unspecified: Secondary | ICD-10-CM | POA: Diagnosis not present

## 2024-09-26 DIAGNOSIS — I10 Essential (primary) hypertension: Secondary | ICD-10-CM | POA: Diagnosis not present

## 2024-09-26 DIAGNOSIS — M810 Age-related osteoporosis without current pathological fracture: Secondary | ICD-10-CM | POA: Diagnosis not present

## 2024-10-15 DIAGNOSIS — I10 Essential (primary) hypertension: Secondary | ICD-10-CM | POA: Diagnosis not present

## 2024-10-26 DIAGNOSIS — I10 Essential (primary) hypertension: Secondary | ICD-10-CM | POA: Diagnosis not present

## 2024-10-26 DIAGNOSIS — E7800 Pure hypercholesterolemia, unspecified: Secondary | ICD-10-CM | POA: Diagnosis not present

## 2024-10-26 DIAGNOSIS — M810 Age-related osteoporosis without current pathological fracture: Secondary | ICD-10-CM | POA: Diagnosis not present

## 2024-11-01 ENCOUNTER — Other Ambulatory Visit: Payer: Self-pay | Admitting: Allergy & Immunology

## 2024-11-05 DIAGNOSIS — H1045 Other chronic allergic conjunctivitis: Secondary | ICD-10-CM | POA: Diagnosis not present

## 2024-11-05 DIAGNOSIS — Z961 Presence of intraocular lens: Secondary | ICD-10-CM | POA: Diagnosis not present

## 2024-11-05 DIAGNOSIS — H53143 Visual discomfort, bilateral: Secondary | ICD-10-CM | POA: Diagnosis not present

## 2024-11-05 DIAGNOSIS — H524 Presbyopia: Secondary | ICD-10-CM | POA: Diagnosis not present

## 2024-11-05 DIAGNOSIS — H5213 Myopia, bilateral: Secondary | ICD-10-CM | POA: Diagnosis not present

## 2024-11-05 DIAGNOSIS — H52223 Regular astigmatism, bilateral: Secondary | ICD-10-CM | POA: Diagnosis not present

## 2024-11-05 DIAGNOSIS — Z9849 Cataract extraction status, unspecified eye: Secondary | ICD-10-CM | POA: Diagnosis not present
